# Patient Record
Sex: Male | Born: 1980 | Race: White | Hispanic: No | Marital: Married | Smoking: Current some day smoker
Health system: Southern US, Community
[De-identification: ages and names within clinical notes are randomized; demographics above are authoritative.]

## PROBLEM LIST (undated history)

## (undated) DIAGNOSIS — N200 Calculus of kidney: Secondary | ICD-10-CM

## (undated) DIAGNOSIS — Z87442 Personal history of urinary calculi: Secondary | ICD-10-CM

## (undated) HISTORY — PX: OTHER SURGICAL HISTORY: SHX169

## (undated) HISTORY — PX: HERNIA REPAIR: SHX51

## (undated) HISTORY — PX: ELBOW SURGERY: SHX618

---
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
## 2003-08-26 ENCOUNTER — Emergency Department (HOSPITAL_COMMUNITY): Admission: EM | Admit: 2003-08-26 | Discharge: 2003-08-26 | Payer: Self-pay | Admitting: *Deleted

## 2005-11-01 ENCOUNTER — Emergency Department (HOSPITAL_COMMUNITY): Admission: EM | Admit: 2005-11-01 | Discharge: 2005-11-01 | Payer: Self-pay | Admitting: Emergency Medicine

## 2009-06-15 ENCOUNTER — Emergency Department (HOSPITAL_COMMUNITY): Admission: EM | Admit: 2009-06-15 | Discharge: 2009-06-15 | Payer: Self-pay | Admitting: Emergency Medicine

## 2010-05-18 ENCOUNTER — Emergency Department (HOSPITAL_COMMUNITY): Admission: EM | Admit: 2010-05-18 | Discharge: 2010-05-19 | Payer: Self-pay | Admitting: Emergency Medicine

## 2011-03-29 LAB — CBC
Hemoglobin: 16.8 g/dL (ref 13.0–17.0)
MCHC: 34.1 g/dL (ref 30.0–36.0)
MCV: 90.8 fL (ref 78.0–100.0)
RBC: 5.41 MIL/uL (ref 4.22–5.81)

## 2011-03-29 LAB — URINE MICROSCOPIC-ADD ON

## 2011-03-29 LAB — URINALYSIS, ROUTINE W REFLEX MICROSCOPIC
Bilirubin Urine: NEGATIVE
Nitrite: NEGATIVE
Specific Gravity, Urine: 1.035 — ABNORMAL HIGH (ref 1.005–1.030)
Urobilinogen, UA: 1 mg/dL (ref 0.0–1.0)

## 2011-03-29 LAB — BASIC METABOLIC PANEL
CO2: 26 mEq/L (ref 19–32)
Chloride: 106 mEq/L (ref 96–112)
Creatinine, Ser: 1.04 mg/dL (ref 0.4–1.5)
GFR calc Af Amer: >60 mL/min (ref >60)
Glucose, Bld: 153 mg/dL — ABNORMAL HIGH (ref 70–99)

## 2011-03-29 LAB — DIFFERENTIAL
Basophils Relative: 1 % (ref 0–1)
Eosinophils Absolute: 0.2 10*3/uL (ref 0.0–0.7)
Monocytes Absolute: 0.6 10*3/uL (ref 0.1–1.0)
Monocytes Relative: 5 % (ref 3–12)
Neutrophils Relative %: 77 % (ref 43–77)

## 2012-03-21 ENCOUNTER — Emergency Department (HOSPITAL_COMMUNITY)
Admission: EM | Admit: 2012-03-21 | Discharge: 2012-03-21 | Disposition: A | Discharge status: Home / Self Care | Payer: Self-pay | Attending: Emergency Medicine | Admitting: Emergency Medicine

## 2012-03-21 ENCOUNTER — Encounter (HOSPITAL_COMMUNITY): Payer: Self-pay | Admitting: Emergency Medicine

## 2012-03-21 DIAGNOSIS — X503XXA Overexertion from repetitive movements, initial encounter: Secondary | ICD-10-CM | POA: Insufficient documentation

## 2012-03-21 DIAGNOSIS — S39012A Strain of muscle, fascia and tendon of lower back, initial encounter: Secondary | ICD-10-CM

## 2012-03-21 DIAGNOSIS — R209 Unspecified disturbances of skin sensation: Secondary | ICD-10-CM | POA: Insufficient documentation

## 2012-03-21 DIAGNOSIS — S335XXA Sprain of ligaments of lumbar spine, initial encounter: Secondary | ICD-10-CM | POA: Insufficient documentation

## 2012-03-21 LAB — URINALYSIS, ROUTINE W REFLEX MICROSCOPIC
Bilirubin Urine: NEGATIVE
Hgb urine dipstick: NEGATIVE
Protein, ur: NEGATIVE mg/dL
Urobilinogen, UA: 0.2 mg/dL (ref 0.0–1.0)

## 2012-03-21 LAB — DIFFERENTIAL
Basophils Absolute: 0 10*3/uL (ref 0.0–0.1)
Eosinophils Absolute: 0.3 10*3/uL (ref 0.0–0.7)
Eosinophils Relative: 5 % (ref 0–5)

## 2012-03-21 LAB — POCT I-STAT, CHEM 8
Creatinine, Ser: 0.8 mg/dL (ref 0.50–1.35)
Hemoglobin: 15.6 g/dL (ref 13.0–17.0)
Sodium: 142 mEq/L (ref 135–145)
TCO2: 26 mmol/L (ref 0–100)

## 2012-03-21 LAB — CBC
MCH: 30.6 pg (ref 26.0–34.0)
MCV: 88.7 fL (ref 78.0–100.0)
Platelets: 237 10*3/uL (ref 150–400)
RDW: 12.6 % (ref 11.5–15.5)

## 2012-03-21 LAB — POCT I-STAT TROPONIN I: Troponin i, poc: 0 ng/mL (ref 0.00–0.08)

## 2012-03-21 MED ORDER — METHOCARBAMOL 500 MG PO TABS
500.0000 mg | ORAL_TABLET | ORAL | Status: AC
  Administered 2012-03-21: 500 mg via ORAL
  Filled 2012-03-21: qty 1

## 2012-03-21 MED ORDER — TRAMADOL HCL 50 MG PO TABS: 50.0000 mg | ORAL_TABLET | Freq: Four times a day (QID) | ORAL | Status: AC | PRN

## 2012-03-21 MED ORDER — DEXAMETHASONE SODIUM PHOSPHATE 10 MG/ML IJ SOLN
10.0000 mg | Freq: Once | INTRAMUSCULAR | Status: AC
  Administered 2012-03-21: 10 mg via INTRAMUSCULAR
  Filled 2012-03-21: qty 1

## 2012-03-21 MED ORDER — CYCLOBENZAPRINE HCL 10 MG PO TABS: 10.0000 mg | ORAL_TABLET | Freq: Two times a day (BID) | ORAL | Status: DC | PRN

## 2012-03-21 MED ORDER — KETOROLAC TROMETHAMINE 60 MG/2ML IM SOLN
60.0000 mg | Freq: Once | INTRAMUSCULAR | Status: AC
  Administered 2012-03-21: 60 mg via INTRAMUSCULAR
  Filled 2012-03-21: qty 2

## 2012-03-21 MED ORDER — CYCLOBENZAPRINE HCL 10 MG PO TABS: 10.0000 mg | ORAL_TABLET | Freq: Two times a day (BID) | ORAL | Status: AC | PRN

## 2012-03-21 NOTE — Discharge Instructions (Signed)
Back Pain, Adult Low back pain is very common. About 1 in 5 people have back pain.The cause of low back pain is rarely dangerous. The pain often gets better over time.About half of people with a sudden onset of back pain feel better in just 2 weeks. About 8 in 10 people feel better by 6 weeks.  CAUSES Some common causes of back pain include:  Strain of the muscles or ligaments supporting the spine.   Wear and tear (degeneration) of the spinal discs.   Arthritis.   Direct injury to the back.  DIAGNOSIS Most of the time, the direct cause of low back pain is not known.However, back pain can be treated effectively even when the exact cause of the pain is unknown.Answering your caregiver's questions about your overall health and symptoms is one of the most accurate ways to make sure the cause of your pain is not dangerous. If your caregiver needs more information, he or she may order lab work or imaging tests (X-rays or MRIs).However, even if imaging tests show changes in your back, this usually does not require surgery. HOME CARE INSTRUCTIONS For many people, back pain returns.Since low back pain is rarely dangerous, it is often a condition that people can learn to manageon their own.   Remain active. It is stressful on the back to sit or stand in one place. Do not sit, drive, or stand in one place for more than 30 minutes at a time. Take short walks on level surfaces as soon as pain allows.Try to increase the length of time you walk each day.   Do not stay in bed.Resting more than 1 or 2 days can delay your recovery.   Do not avoid exercise or work.Your body is made to move.It is not dangerous to be active, even though your back may hurt.Your back will likely heal faster if you return to being active before your pain is gone.   Pay attention to your body when you bend and lift. Many people have less discomfortwhen lifting if they bend their knees, keep the load close to their  bodies,and avoid twisting. Often, the most comfortable positions are those that put less stress on your recovering back.   Find a comfortable position to sleep. Use a firm mattress and lie on your side with your knees slightly bent. If you lie on your back, put a pillow under your knees.   Only take over-the-counter or prescription medicines as directed by your caregiver. Over-the-counter medicines to reduce pain and inflammation are often the most helpful.Your caregiver may prescribe muscle relaxant drugs.These medicines help dull your pain so you can more quickly return to your normal activities and healthy exercise.   Put ice on the injured area.   Put ice in a plastic bag.   Place a towel between your skin and the bag.   Leave the ice on for 15 to 20 minutes, 3 to 4 times a day for the first 2 to 3 days. After that, ice and heat may be alternated to reduce pain and spasms.   Ask your caregiver about trying back exercises and gentle massage. This may be of some benefit.   Avoid feeling anxious or stressed.Stress increases muscle tension and can worsen back pain.It is important to recognize when you are anxious or stressed and learn ways to manage it.Exercise is a great option.  SEEK MEDICAL CARE IF:  You have pain that is not relieved with rest or medicine.   You have   pain that does not improve in 1 week.   You have new symptoms.   You are generally not feeling well.  SEEK IMMEDIATE MEDICAL CARE IF:   You have pain that radiates from your back into your legs.   You develop new bowel or bladder control problems.   You have unusual weakness or numbness in your arms or legs.   You develop nausea or vomiting.   You develop abdominal pain.   You feel faint.  Document Released: 12/06/2005 Document Revised: 11/25/2011 Document Reviewed: 04/26/2011 [employer] Patient Information 2012 [employer]. 

## 2012-03-21 NOTE — ED Notes (Signed)
Pt c/o right lower back pain with radiation down right leg; pt sts was moving this weekend and started after that

## 2012-03-21 NOTE — ED Provider Notes (Signed)
History     CSN: [id number]  Arrival date & time 03/21/12  4098   First MD Initiated Contact with Patient 03/21/12 1005     10:15AM HPI  Patient reports she was lifting heavy objects over this past weekend. States things A. he woke with right-sided lower back pain. States that pain radiated down right lateral leg. Reports pain is worse with standing up straight. Denies saddle anesthesias, perineal numbness, tingling, weakness, abdominal pain, nausea, vomiting, diarrhea, urinary symptoms, fever, or history of prior back pain. Patient is a 31 y.o. male presenting with back pain. The history is provided by the patient.  Back Pain  This is a new problem. The current episode started 2 days ago. The problem has been gradually worsening. The pain is associated with lifting heavy objects. The pain is present in the lumbar spine. The quality of the pain is described as stabbing and shooting. The pain radiates to the right thigh and right knee. The pain is severe. The symptoms are aggravated by twisting and certain positions. Associated symptoms include leg pain. Pertinent negatives include no chest pain, no fever, no numbness, no headaches, no abdominal pain, no bowel incontinence, no perianal numbness, no bladder incontinence, no dysuria, no pelvic pain, no paresthesias, no paresis, no tingling and no weakness. He has tried bed rest and NSAIDs for the symptoms. The treatment provided no relief.    History reviewed. No pertinent past medical history.  History reviewed. No pertinent past surgical history.  History reviewed. No pertinent family history.  History  Substance Use Topics  . Smoking status: Current Everyday Smoker  . Smokeless tobacco: Not on file  . Alcohol Use: No      Review of Systems  Constitutional: Positive for appetite change. Negative for fever and chills.  HENT: Negative for neck pain.   Respiratory: Negative for cough and shortness of breath.   Cardiovascular: Negative  for chest pain and palpitations.  Gastrointestinal: Negative for nausea, vomiting, abdominal pain and bowel incontinence.  Genitourinary: Negative for bladder incontinence, dysuria, hematuria, flank pain and pelvic pain.  Musculoskeletal: Positive for back pain. Negative for myalgias and gait problem.       Denies saddle anesthesias, perineal numbness, bowel incontinence, urinary incontinence  Neurological: Negative for dizziness, tingling, weakness, numbness, headaches and paresthesias.  All other systems reviewed and are negative.    Allergies  Sulfa antibiotics  Home Medications   Current Outpatient Rx  Name Route Sig Dispense Refill  . IBUPROFEN 200 MG PO TABS Oral Take 400 mg by mouth every 6 (six) hours as needed. For headache    . NAPROXEN SODIUM 220 MG PO TABS Oral Take 440 mg by mouth 2 (two) times daily as needed. Fir headache      BP 148/94  Pulse 77  Temp(Src) 97.5 F (36.4 C) (Oral)  Resp 22  SpO2 100%  Physical Exam  Constitutional: He is oriented to person, place, and time. He appears well-developed and well-nourished.  HENT:  Head: Normocephalic and atraumatic.  Eyes: Conjunctivae are normal. Pupils are equal, round, and reactive to light.  Neck: Normal range of motion. Neck supple.  Cardiovascular: Normal rate, regular rhythm and normal heart sounds.   Pulmonary/Chest: Effort normal and breath sounds normal.  Abdominal: Soft. Normal appearance and bowel sounds are normal. There is no hepatosplenomegaly. There is no rigidity, no rebound, no guarding, no CVA tenderness, no tenderness at McBurney's point and negative Murphy's sign. No hernia.    Musculoskeletal:  Lumbar back: He exhibits tenderness, pain and spasm. He exhibits normal range of motion, no bony tenderness, no swelling and normal pulse.       Back:  Neurological: He is alert and oriented to person, place, and time.  Skin: Skin is warm and dry. No rash noted. No erythema. No pallor.    Psychiatric: He has a normal mood and affect. His behavior is normal.    ED Course  Procedures  Results for orders placed during the hospital encounter of 03/21/12  URINALYSIS, ROUTINE W REFLEX MICROSCOPIC      Component Value Range   Color, Urine YELLOW  YELLOW    APPearance CLEAR  CLEAR    Specific Gravity, Urine 1.029  1.005 - 1.030    pH 6.0  5.0 - 8.0    Glucose, UA NEGATIVE  NEGATIVE (mg/dL)   Hgb urine dipstick NEGATIVE  NEGATIVE    Bilirubin Urine NEGATIVE  NEGATIVE    Ketones, ur NEGATIVE  NEGATIVE (mg/dL)   Protein, ur NEGATIVE  NEGATIVE (mg/dL)   Urobilinogen, UA 0.2  0.0 - 1.0 (mg/dL)   Nitrite NEGATIVE  NEGATIVE    Leukocytes, UA NEGATIVE  NEGATIVE   CBC      Component Value Range   WBC 5.1  4.0 - 10.5 (K/uL)   RBC 4.94  4.22 - 5.81 (MIL/uL)   Hemoglobin 15.1  13.0 - 17.0 (g/dL)   HCT 96.0  45.4 - 09.8 (%)   MCV 88.7  78.0 - 100.0 (fL)   MCH 30.6  26.0 - 34.0 (pg)   MCHC 34.5  30.0 - 36.0 (g/dL)   RDW 11.9  14.7 - 82.9 (%)   Platelets 237  150 - 400 (K/uL)  DIFFERENTIAL      Component Value Range   Neutrophils Relative 55  43 - 77 (%)   Neutro Abs 2.8  1.7 - 7.7 (K/uL)   Lymphocytes Relative 34  12 - 46 (%)   Lymphs Abs 1.7  0.7 - 4.0 (K/uL)   Monocytes Relative 7  3 - 12 (%)   Monocytes Absolute 0.3  0.1 - 1.0 (K/uL)   Eosinophils Relative 5  0 - 5 (%)   Eosinophils Absolute 0.3  0.0 - 0.7 (K/uL)   Basophils Relative 0  0 - 1 (%)   Basophils Absolute 0.0  0.0 - 0.1 (K/uL)  POCT I-STAT, CHEM 8      Component Value Range   Sodium 142  135 - 145 (mEq/L)   Potassium 4.5  3.5 - 5.1 (mEq/L)   Chloride 106  96 - 112 (mEq/L)   BUN 14  6 - 23 (mg/dL)   Creatinine, Ser 5.62  0.50 - 1.35 (mg/dL)   Glucose, Bld 98  70 - 99 (mg/dL)   Calcium, Ion 1.30  8.65 - 1.32 (mmol/L)   TCO2 26  0 - 100 (mmol/L)   Hemoglobin 15.6  13.0 - 17.0 (g/dL)   HCT 78.4  69.6 - 29.5 (%)  POCT I-STAT TROPONIN I      Component Value Range   Troponin i, poc 0.00  0.00 - 0.08  (ng/mL)   Comment 3            No results found.   MDM   Labs are all within normal limits. Patient has mild relief after medication. Will give additional Toradol shot and will sendhome with Ultram and Flexeril. Patient agrees with plan and is ready for discharge.      Thomasene Lot, PA-C 03/21/12  1118 

## 2012-03-21 NOTE — Progress Notes (Signed)
Met with patient and discussed Medicaid options and orange card. Directed family to their DSS case worker for coverage for the children. Gave patient a community discount prescription card.

## 2012-03-21 NOTE — ED Notes (Signed)
Pt ambulated to and from bathroom to void, tolerated well.

## 2012-03-21 NOTE — ED Notes (Signed)
Pt presents with R sided lower back pain since Sunday.  Pt reports moving furniture on Saturday with onset of pain the next day.  Pt reports chronic back pain, but mainly on L side.  Pt reports pain radiates into R thigh.  Pt denies any urinary or fecal incontinence.

## 2012-03-21 NOTE — ED Provider Notes (Signed)
Medical screening examination/treatment/procedure(s) were performed by non-physician practitioner and as supervising physician I was immediately available for consultation/collaboration.  Toy Baker, MD 03/21/12 1550

## 2012-03-29 ENCOUNTER — Encounter (HOSPITAL_COMMUNITY): Payer: Self-pay | Admitting: Emergency Medicine

## 2012-03-29 ENCOUNTER — Emergency Department (HOSPITAL_COMMUNITY)
Admission: EM | Admit: 2012-03-29 | Discharge: 2012-03-29 | Disposition: A | Discharge status: Home / Self Care | Payer: Self-pay | Attending: Emergency Medicine | Admitting: Emergency Medicine

## 2012-03-29 DIAGNOSIS — K089 Disorder of teeth and supporting structures, unspecified: Secondary | ICD-10-CM | POA: Insufficient documentation

## 2012-03-29 DIAGNOSIS — K0889 Other specified disorders of teeth and supporting structures: Secondary | ICD-10-CM

## 2012-03-29 DIAGNOSIS — F17200 Nicotine dependence, unspecified, uncomplicated: Secondary | ICD-10-CM | POA: Insufficient documentation

## 2012-03-29 MED ORDER — NAPROXEN 500 MG PO TABS: 500.0000 mg | ORAL_TABLET | Freq: Two times a day (BID) | ORAL | Status: DC

## 2012-03-29 MED ORDER — HYDROCODONE-ACETAMINOPHEN 5-325 MG PO TABS: ORAL_TABLET | ORAL | Status: AC

## 2012-03-29 MED ORDER — HYDROCODONE-ACETAMINOPHEN 5-325 MG PO TABS
1.0000 | ORAL_TABLET | Freq: Once | ORAL | Status: AC
  Administered 2012-03-29: 1 via ORAL
  Filled 2012-03-29: qty 1

## 2012-03-29 MED ORDER — PENICILLIN V POTASSIUM 500 MG PO TABS: 500.0000 mg | ORAL_TABLET | Freq: Three times a day (TID) | ORAL | Status: AC

## 2012-03-29 NOTE — Discharge Instructions (Signed)
Please read and follow all provided instructions.  Your diagnoses today include:  1. Pain, dental     Tests performed today include:  Vital signs. See below for your results today.   Medications prescribed:   Vicodin (hydrocodone/acetaminophen) - narcotic pain medication  You have been prescribed narcotic pain medication such as Vicodin, Percocet, or Ultram: DO NOT drive or perform any activities that require you to be awake and alert because this medicine can make you drowsy. BE VERY CAREFUL not to take multiple medicines containing Tylenol (also called acetaminophen). Doing so can lead to an overdose which can damage your liver and cause liver failure and possibly death.    Naproxen - anti-inflammatory pain medication  Do not exceed 500mg  naproxen every 12 hours  You have been prescribed an anti-inflammatory medication or NSAID. Take with food. Take smallest effective dose for the shortest duration needed for your pain. Stop taking if you experience stomach pain or vomiting.    Penicillin - antibiotic for dental infection  You have been prescribed an antibiotic medicine: take the entire course of medicine even if you are feeling better. Stopping early can cause the antibiotic not to work.  Take any prescribed medications only as directed.  Home care instructions:  Follow any educational materials contained in this packet.  Follow-up instructions: Please follow-up with your dentist for further evaluation of your symptoms. If you do not have a dentist or primary care doctor -- see below for referral information.   The exam and treatment you received today has been provided on an emergency basis only. This is not a substitute for complete medical or dental care. If your problem worsens or new symptoms (problems) appear, and you are unable to arrange prompt follow-up care with your dentist, return to this location.  Return instructions:   Please return to the Emergency  Department if you experience worsening symptoms.  Please return if you develop a fever, you develop more swelling in your face or neck, you have trouble breathing or swallowing food.  Please return if you have any other emergent concerns.  Additional Information:  Your vital signs today were: BP 139/86  Pulse 96  Temp(Src) 98.6 F (37 C) (Oral)  Resp 18  SpO2 98% If your blood pressure (BP) was elevated above 135/85 this visit, please have this repeated by your doctor within one month. -------------- No Primary Care Doctor Call [facility]  [phone] Other agencies that provide inexpensive medical care    [facility]  [phone]    [facility]  [phone]    [facility]  [phone]    [facility]  [phone]    [facility]  [phone]    [facility]  [phone] -------------- RESOURCE GUIDE:  Dental Problems  Patients with Medicaid: [facility] [phone]                                            [phone] [address] Phone:  [phone]  Phone:  [phone]  If unable to pay or uninsured, contact:  [facility] or [facility]. to become qualified for the adult dental clinic.  Chronic Pain Problems Contact [facility]  [phone] Patients need to be referred by their primary care doctor.  Insufficient Money for Medicine Contact [employer]:  call "211" or [facility] [phone].  Psychological Services [facility]  [phone] [facility]  [phone] [facility]   [phone] (emergency services [phone])  Substance Abuse Resources [facility]  [phone] [facility] [phone] [facility] [place] [phone] Floydene Flock [phone] Residential & Outpatient Substance Abuse Program   [phone]  Abuse/Neglect [facility] Child Abuse Hotline 9035771029 [facility] Child Abuse Hotline [phone] (After Hours)  Emergency Shelter [facility] [phone]  M[facility] [place] [facility] [place] [phone] [employer] [facility] [phone]  [facility] Resources  [facility]     [employer]                          [facility] Dept. [address] [place]                       [address]      [address]  Blondell Reveal Phone:  [phone]                                   Phone:  [phone]                 Phone:  [phone]  [facility] Phone:  [phone]  [facility] Child Abuse Hotline [phone] [phone] (After Hours)

## 2012-03-29 NOTE — ED Notes (Signed)
Onset 3-4 months ago eating and chipped tooth. States pain started today worsening overtime currently 10/10 throbbing radiating to left neck.  Airway intact bilateral equal chest rise and fall.

## 2012-03-29 NOTE — ED Provider Notes (Signed)
History     CSN: [id number]  Arrival date & time 03/29/12  1524   First MD Initiated Contact with Patient 03/29/12 1556      Chief Complaint  Patient presents with  . Dental Pain    (Consider location/radiation/quality/duration/timing/severity/associated sxs/prior treatment) HPI Comments: Patient presents with tooth pain has worsened since this morning. Patient complains of pain in the lower left jaw. This is in an area of a broken tooth. He states that pain radiates to the neck and he feels like this area is swollen. Patient denies fever or trouble breathing. Patient has been using Orajel without relief.  Patient is a 31 y.o. male presenting with tooth pain. The history is provided by the patient.  Dental PainThe primary symptoms include mouth pain and headaches. Primary symptoms do not include fever, shortness of breath or sore throat. The symptoms began 6 to 12 hours ago. The symptoms are unchanged.  Additional symptoms include: facial swelling. Additional symptoms do not include: trouble swallowing and ear pain.    History reviewed. No pertinent past medical history.  History reviewed. No pertinent past surgical history.  No family history on file.  History  Substance Use Topics  . Smoking status: Current Everyday Smoker  . Smokeless tobacco: Not on file  . Alcohol Use: No      Review of Systems  Constitutional: Negative for fever.  HENT: Positive for facial swelling and dental problem. Negative for ear pain, sore throat, trouble swallowing and neck pain.   Respiratory: Negative for shortness of breath and stridor.   Skin: Negative for color change.  Neurological: Positive for headaches.    Allergies  Sulfa antibiotics  Home Medications   Current Outpatient Rx  Name Route Sig Dispense Refill  . CYCLOBENZAPRINE HCL 10 MG PO TABS Oral Take 1 tablet (10 mg total) by mouth 2 (two) times daily as needed for muscle spasms. 20 tablet 0  . NAPROXEN SODIUM 220 MG PO  TABS Oral Take 440 mg by mouth 2 (two) times daily as needed. for headache.    . TRAMADOL HCL 50 MG PO TABS Oral Take 1 tablet (50 mg total) by mouth every 6 (six) hours as needed for pain. 30 tablet 0    BP 139/86  Pulse 96  Temp(Src) 98.6 F (37 C) (Oral)  Resp 18  SpO2 98%  Physical Exam  Nursing note and vitals reviewed. Constitutional: He is oriented to person, place, and time. He appears well-developed and well-nourished.  HENT:  Head: Normocephalic and atraumatic. No trismus in the jaw.  Right Ear: Tympanic membrane, external ear and ear canal normal.  Left Ear: Tympanic membrane, external ear and ear canal normal.  Nose: Nose normal.  Mouth/Throat: Uvula is midline, oropharynx is clear and moist and mucous membranes are normal. Abnormal dentition. Dental caries present. No dental abscesses or uvula swelling. No tonsillar abscesses.       Patient with L mandibular tooth pain and tenderness to palpation in area of second molar. Tooth is noted to be fractured. No swelling or erythema noted on exam. No gross facial or neck swelling.  Eyes: Pupils are equal, round, and reactive to light.  Neck: Normal range of motion. Neck supple.       No neck swelling or Lugwig's angina  Neurological: He is alert and oriented to person, place, and time.  Skin: Skin is warm and dry.  Psychiatric: He has a normal mood and affect.    ED Course  Procedures (including critical care  time)  Labs Reviewed - No data to display No results found.   1. Pain, dental     4:19 PM Patient seen and examined. Work-up initiated. Medications ordered.   Vital signs reviewed and are as follows: Filed Vitals:   03/29/12 1530  BP: 139/86  Pulse: 96  Temp: 98.6 F (37 C)  Resp: 18   Patient counseled to take prescribed medications as directed, return with worsening facial or neck swelling, and to follow-up with his dentist as soon as possible.   Patient counseled on use of narcotic pain medications.  Counseled not to combine these medications with others containing tylenol. Urged not to drink alcohol, drive, or perform any other activities that requires focus while taking these medications. The patient verbalizes understanding and agrees with the plan.  MDM  Patient with toothache.  No gross abscess.  Exam unconcerning for Ludwig's angina or other deep tissue infection in neck.  Will treat with penicillin and pain medicine.  Urged patient to follow-up with dentist.           Renne Crigler, PA 03/29/12 [phone]

## 2012-03-31 NOTE — ED Provider Notes (Signed)
Medical screening examination/treatment/procedure(s) were performed by non-physician practitioner and as supervising physician I was immediately available for consultation/collaboration.  Latica Hohmann T Karletta Millay, MD 03/31/12 1547 

## 2012-07-08 ENCOUNTER — Encounter (HOSPITAL_COMMUNITY): Payer: Self-pay | Admitting: *Deleted

## 2012-07-08 ENCOUNTER — Emergency Department (HOSPITAL_COMMUNITY)
Admission: EM | Admit: 2012-07-08 | Discharge: 2012-07-08 | Discharge status: Against Medical Advice | Payer: Medicaid Other | Attending: Emergency Medicine | Admitting: Emergency Medicine

## 2012-07-08 DIAGNOSIS — T63461A Toxic effect of venom of wasps, accidental (unintentional), initial encounter: Secondary | ICD-10-CM | POA: Insufficient documentation

## 2012-07-08 DIAGNOSIS — T6391XA Toxic effect of contact with unspecified venomous animal, accidental (unintentional), initial encounter: Secondary | ICD-10-CM | POA: Insufficient documentation

## 2012-07-08 NOTE — ED Notes (Signed)
Pt sts that he is feeling much better and he doesn't want to stay. Tried to convince patient to let us watch him to ensure his condition doesn't worsen but pt refused.

## 2012-07-08 NOTE — ED Notes (Signed)
Reports wasp sting 20 mins pta. 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 Bee sting is to right side abd, redness noted. Denies any symptoms at this time, no sob, airway intact. resp e/u. Skin w/d.

## 2018-01-01 ENCOUNTER — Emergency Department (HOSPITAL_COMMUNITY)
Admission: EM | Admit: 2018-01-01 | Discharge: 2018-01-01 | Disposition: A | Discharge status: Home / Self Care | Payer: Self-pay | Attending: Emergency Medicine | Admitting: Emergency Medicine

## 2018-01-01 ENCOUNTER — Encounter (HOSPITAL_COMMUNITY): Payer: Self-pay | Admitting: *Deleted

## 2018-01-01 ENCOUNTER — Other Ambulatory Visit: Payer: Self-pay

## 2018-01-01 DIAGNOSIS — L03116 Cellulitis of left lower limb: Secondary | ICD-10-CM | POA: Insufficient documentation

## 2018-01-01 DIAGNOSIS — F17200 Nicotine dependence, unspecified, uncomplicated: Secondary | ICD-10-CM | POA: Insufficient documentation

## 2018-01-01 MED ORDER — CLINDAMYCIN HCL 150 MG PO CAPS: 300.0000 mg | ORAL_CAPSULE | Freq: Four times a day (QID) | ORAL | 0 refills | Status: DC

## 2018-01-01 MED ORDER — CLINDAMYCIN HCL 150 MG PO CAPS
300.0000 mg | ORAL_CAPSULE | Freq: Once | ORAL | Status: AC
  Administered 2018-01-01: 300 mg via ORAL
  Filled 2018-01-01: qty 2

## 2018-01-01 NOTE — ED Provider Notes (Signed)
4   [facility] EMERGENCY DEPARTMENT Provider Note   CSN: [id number] Arrival date & time: 01/01/18  2048     History   Chief Complaint Chief Complaint  Patient presents with  . Leg Pain    HPI Kyle Ellis is a 37 y.o. male.  Patient presents to the ER for evaluation of a tender knots and redness of the left thigh.  Symptoms began 3 days ago.  Patient reports that he injects testosterone into his thighs.  He did that 3 mornings ago and then went to the gym and worked out.  While he was working out he noted a large tender nodule on the anterior portion of the thigh where he injected.  Pain was much worse when he bent the knee.  Over the ensuing days he has developed redness over the anterior portion of the thigh.  The knot, however, has gone down.  He has not had any fever.      History reviewed. No pertinent past medical history.  There are no active problems to display for this patient.   Past Surgical History:  Procedure Laterality Date  . HERNIA REPAIR     20 years ago       Home Medications    Prior to Admission medications   Medication Sig Start Date End Date Taking? Authorizing Provider  ibuprofen (ADVIL,MOTRIN) 200 MG tablet Take 800 mg by mouth every 8 (eight) hours as needed (leg swelling).   Yes [provider]  clindamycin (CLEOCIN) 150 MG capsule Take 2 capsules (300 mg total) by mouth 4 (four) times daily. 01/01/18   Gilda CreasePollina, Thorvald Orsino J, MD    Family History No family history on file.  Social History Social History   Tobacco Use  . Smoking status: Current Every Day Smoker  Substance Use Topics  . Alcohol use: No  . Drug use: No     Allergies   Bee venom and Sulfa antibiotics   Review of Systems Review of Systems  Musculoskeletal: Positive for myalgias.  Skin: Positive for color change.  All other systems reviewed and are negative.    Physical Exam Updated Vital Signs BP 128/84 (BP Location: Left Arm)   Pulse 97    Temp 98.6 F (37 C) (Oral)   Resp 16   Ht 6\' 3"  (1.905 m)   Wt 91.2 kg (201 lb)   SpO2 99%   BMI 25.12 kg/m   Physical Exam  Constitutional: He is oriented to person, place, and time. He appears well-developed and well-nourished. No distress.  HENT:  Head: Normocephalic and atraumatic.  Right Ear: Hearing normal.  Left Ear: Hearing normal.  Nose: Nose normal.  Mouth/Throat: Oropharynx is clear and moist and mucous membranes are normal.  Eyes: Conjunctivae and EOM are normal. Pupils are equal, round, and reactive to light.  Neck: Normal range of motion. Neck supple.  Cardiovascular: Regular rhythm, S1 normal and S2 normal. Exam reveals no gallop and no friction rub.  No murmur heard. Pulmonary/Chest: Effort normal and breath sounds normal. No respiratory distress. He exhibits no tenderness.  Abdominal: Soft. Normal appearance and bowel sounds are normal. There is no hepatosplenomegaly. There is no tenderness. There is no rebound, no guarding, no tenderness at McBurney's point and negative Murphy's sign. No hernia.  Musculoskeletal: Normal range of motion.       Left upper leg: He exhibits tenderness (anterior).  Neurological: He is alert and oriented to person, place, and time. He has normal strength. No cranial  nerve deficit or sensory deficit. Coordination normal. GCS eye subscore is 4. GCS verbal subscore is 5. GCS motor subscore is 6.  Skin: Skin is warm, dry and intact. No rash noted. There is erythema (anterior left thigh). No cyanosis.  Psychiatric: He has a normal mood and affect. His speech is normal and behavior is normal. Thought content normal.  Nursing note and vitals reviewed.    ED Treatments / Results  Labs (all labs ordered are listed, but only abnormal results are displayed) Labs Reviewed - No data to display  EKG  EKG Interpretation None       Radiology No results found.  Procedures Procedures (including critical care time)  Medications Ordered in  ED Medications  clindamycin (CLEOCIN) capsule 300 mg (not administered)     Initial Impression / Assessment and Plan / ED Course  I have reviewed the triage vital signs and the nursing notes.  Pertinent labs & imaging results that were available during my care of the patient were reviewed by me and considered in my medical decision making (see chart for details).     Patient presents to the ER for evaluation of a tender knot in the left thigh at the area of intramuscular injection with surrounding erythema.  Is able to lift the leg without difficulty.  When he bends the knee he does report some "tightness" of the anterior thigh but no pain.  He reports that there was a large nodule there initially but this has gone down.  I suspect that there was some intra-muscular hematoma formation from the injection, but there is no sign of abscess at this time.  This includes looking at the area with ultrasound and only seeing striated muscle fibers with no evidence of any fluid collections. Will treat with antibiotics.  EMERGENCY DEPARTMENT US SOFT TISSUE INTERPRETATION "Study: Limited Soft Tissue Ultrasound"  INDICATIONS: Soft tissue infection Multiple views of the body part were obtained in real-time with a multi-frequency linear probe  PERFORMED BY: Myself IMAGES ARCHIVED?: Yes SIDE:Left BODY PART:Lower extremity INTERPRETATION:  No abcess noted and Cellulitis present    Final Clinical Impressions(s) / ED Diagnoses   Final diagnoses:  Cellulitis of left lower extremity    ED Discharge Orders        Ordered    clindamycin (CLEOCIN) 150 MG capsule  4 times daily     01/01/18 2326       Gilda Crease, MD 01/01/18 2326

## 2018-01-01 NOTE — ED Triage Notes (Addendum)
Pt reports left thigh swelling and pain since Thursday. Pt reports he took a testosterone shot and injected it into his left thigh. Pt reports continued swelling that started in his left thigh the redness and swelling is in his left knee.

## 2018-05-24 ENCOUNTER — Other Ambulatory Visit: Payer: Self-pay

## 2018-05-24 ENCOUNTER — Encounter (HOSPITAL_COMMUNITY): Payer: Self-pay | Admitting: Emergency Medicine

## 2018-05-24 ENCOUNTER — Emergency Department (HOSPITAL_COMMUNITY)
Admission: EM | Admit: 2018-05-24 | Discharge: 2018-05-24 | Disposition: A | Discharge status: Home / Self Care | Payer: Self-pay | Attending: Emergency Medicine | Admitting: Emergency Medicine

## 2018-05-24 ENCOUNTER — Emergency Department (HOSPITAL_COMMUNITY): Payer: Self-pay

## 2018-05-24 DIAGNOSIS — R202 Paresthesia of skin: Secondary | ICD-10-CM | POA: Insufficient documentation

## 2018-05-24 DIAGNOSIS — F17200 Nicotine dependence, unspecified, uncomplicated: Secondary | ICD-10-CM | POA: Insufficient documentation

## 2018-05-24 DIAGNOSIS — M5136 Other intervertebral disc degeneration, lumbar region: Secondary | ICD-10-CM | POA: Insufficient documentation

## 2018-05-24 DIAGNOSIS — Z79899 Other long term (current) drug therapy: Secondary | ICD-10-CM | POA: Insufficient documentation

## 2018-05-24 DIAGNOSIS — M5432 Sciatica, left side: Secondary | ICD-10-CM | POA: Insufficient documentation

## 2018-05-24 MED ORDER — DEXAMETHASONE SODIUM PHOSPHATE 10 MG/ML IJ SOLN
10.0000 mg | Freq: Once | INTRAMUSCULAR | Status: AC
  Administered 2018-05-24: 10 mg via INTRAMUSCULAR
  Filled 2018-05-24: qty 1

## 2018-05-24 MED ORDER — ONDANSETRON HCL 4 MG PO TABS
4.0000 mg | ORAL_TABLET | Freq: Once | ORAL | Status: AC
  Administered 2018-05-24: 4 mg via ORAL
  Filled 2018-05-24: qty 1

## 2018-05-24 MED ORDER — DIAZEPAM 5 MG PO TABS
ORAL_TABLET | ORAL | Status: AC
  Filled 2018-05-24: qty 1

## 2018-05-24 MED ORDER — DICLOFENAC SODIUM 75 MG PO TBEC: 75.0000 mg | DELAYED_RELEASE_TABLET | Freq: Two times a day (BID) | ORAL | 0 refills | Status: DC

## 2018-05-24 MED ORDER — DEXAMETHASONE 4 MG PO TABS: 4.0000 mg | ORAL_TABLET | Freq: Two times a day (BID) | ORAL | 0 refills | Status: DC

## 2018-05-24 MED ORDER — CYCLOBENZAPRINE HCL 10 MG PO TABS: 10.0000 mg | ORAL_TABLET | Freq: Three times a day (TID) | ORAL | 0 refills | Status: DC

## 2018-05-24 MED ORDER — KETOROLAC TROMETHAMINE 10 MG PO TABS
10.0000 mg | ORAL_TABLET | Freq: Once | ORAL | Status: AC
  Administered 2018-05-24: 10 mg via ORAL
  Filled 2018-05-24: qty 1

## 2018-05-24 MED ORDER — DIAZEPAM 5 MG PO TABS
10.0000 mg | ORAL_TABLET | Freq: Once | ORAL | Status: AC
  Administered 2018-05-24: 10 mg via ORAL
  Filled 2018-05-24: qty 2

## 2018-05-24 NOTE — Discharge Instructions (Addendum)
Your x-ray shows loss of the disc height at the L3-L4, L4-L5, and L5-S1 area.  Your examination is negative for an  emergent neurologic change, but there is noted changes in your sensory area, as well as in your strength area on the left.  Please see Dr. Eulah PontMurphy for orthopedic evaluation.  An order for outpatient MRI has been sent to the MRI office.  Please call the radiology department for a time for your MRI on tomorrow.  Please use Flexeril 3 times daily for spasm pain, Decadron 2 times daily and diclofenac 2 times daily with food.  Flexeril may cause drowsiness.  Please do not drive a vehicle, drink alcohol, operate machinery, or participate in activities requiring concentration when taking this medication.

## 2018-05-24 NOTE — ED Triage Notes (Signed)
Pt c/o left lower back pain radiating into left leg x 4 weeks. Getting worse per pt. Aleve with no relief.

## 2018-05-24 NOTE — ED Notes (Signed)
Patient returned from radiology

## 2018-05-24 NOTE — ED Provider Notes (Signed)
[facility] EMERGENCY DEPARTMENT Provider Note   CSN: [id number] Arrival date & time: 05/24/18  1737     History   Chief Complaint Chief Complaint  Patient presents with  . Back Pain    HPI Kyle Ellis is a 37 y.o. male.  Patient is a 37 year old male who presents to the emergency department with a complaint of back pain.  The patient states that this problem is been going on at least for 4 weeks.  The patient states that during his teen years he did a lot of weightlifting and was very aggressive in sports.  He injured his back during that time, he is not sure what diagnosis he was told he had, but was told eventually he may have to have surgery.  Through the years the patient states he has been working out on a regular basis, but now he notices that he has pain in the lower back that radiates into the left buttocks, down the left leg down to the ankle.  The patient states that there are times when he has numbness of his thigh.  He has to look to see if his wife might be touching his thigh especially when they are driving.  The patient states that he thinks he is also noticed a change in sensation in his private area, as he notices more difficulty with becoming aroused during sexual activities.  The patient denies any loss of bowel or bladder function.  He states that he has some weakness of his leg, and has to concentrate on putting his left foot in front of the right, and it seems stiff or heavy at times.  The pain and the numbness are worse with standing, and better with lying down.  The patient also notices that there is an improvement when he is exercising, but when he cools off after exercising the pain and stiffness and numbness return in some time or worse.     History reviewed. No pertinent past medical history.  There are no active problems to display for this patient.   Past Surgical History:  Procedure Laterality Date  . HERNIA REPAIR     20 years ago        Home  Medications    Prior to Admission medications   Medication Sig Start Date End Date Taking? Authorizing Provider  clindamycin (CLEOCIN) 150 MG capsule Take 2 capsules (300 mg total) by mouth 4 (four) times daily. 01/01/18   Gilda Crease, MD  ibuprofen (ADVIL,MOTRIN) 200 MG tablet Take 800 mg by mouth every 8 (eight) hours as needed (leg swelling).    [provider]    Family History History reviewed. No pertinent family history.  Social History Social History   Tobacco Use  . Smoking status: Current Every Day Smoker  . Smokeless tobacco: Former Engineer, water Use Topics  . Alcohol use: Yes    Comment: weekends  . Drug use: No     Allergies   Bee venom and Sulfa antibiotics   Review of Systems Review of Systems  Constitutional: Negative for activity change.       All ROS Neg except as noted in HPI  HENT: Negative for nosebleeds.   Eyes: Negative for photophobia and discharge.  Respiratory: Negative for cough, shortness of breath and wheezing.   Cardiovascular: Negative for chest pain and palpitations.  Gastrointestinal: Negative for abdominal pain and blood in stool.  Genitourinary: Negative for dysuria, frequency and hematuria.  Musculoskeletal: Positive for back pain  and gait problem. Negative for arthralgias and neck pain.  Skin: Negative.   Neurological: Positive for weakness and numbness. Negative for dizziness, seizures and speech difficulty.  Psychiatric/Behavioral: Negative for confusion and hallucinations.     Physical Exam Updated Vital Signs BP 127/90 (BP Location: Right Arm)   Pulse 61   Temp 98 F (36.7 C) (Oral)   Resp 18   SpO2 98%   Physical Exam  Constitutional: He is oriented to person, place, and time. He appears well-developed and well-nourished.  Non-toxic appearance.  HENT:  Head: Normocephalic.  Right Ear: Tympanic membrane and external ear normal.  Left Ear: Tympanic membrane and external ear normal.  Eyes: Pupils  are equal, round, and reactive to light. EOM and lids are normal.  Neck: Normal range of motion. Neck supple. Carotid bruit is not present.  Cardiovascular: Normal rate, regular rhythm, normal heart sounds, intact distal pulses and normal pulses.  Pulmonary/Chest: Breath sounds normal. No respiratory distress.  Abdominal: Soft. Bowel sounds are normal. There is no tenderness. There is no guarding.  Musculoskeletal:       Lumbar back: He exhibits decreased range of motion, tenderness and spasm.       Back:       Legs: Lymphadenopathy:       Head (right side): No submandibular adenopathy present.       Head (left side): No submandibular adenopathy present.    He has no cervical adenopathy.  Neurological: He is alert and oriented to person, place, and time. He has normal strength. No cranial nerve deficit or sensory deficit.  Skin: Skin is warm and dry.  Psychiatric: He has a normal mood and affect. His speech is normal.  Nursing note and vitals reviewed.    ED Treatments / Results  Labs (all labs ordered are listed, but only abnormal results are displayed) Labs Reviewed - No data to display  EKG None  Radiology Dg Lumbar Spine Complete  Result Date: 05/24/2018 CLINICAL DATA:  Left-sided lower back pain with left leg radiculopathy. EXAM: LUMBAR SPINE - COMPLETE 4+ VIEW COMPARISON:  CT abdomen pelvis dated June 15, 2009. FINDINGS: Five lumbar type vertebral bodies. No acute fracture or subluxation. Vertebral body heights are preserved. Alignment is normal. Mild-to-moderate disc height loss at L3-L4, L4-L5, and L5-S1, slightly progressed when compared to prior study. Mild facet arthropathy at L4-L5 and L5-S1. The sacroiliac joints are unremarkable. IMPRESSION: 1. Mild-to-moderate degenerative disc disease of the lower lumbar spine. No acute osseous abnormality. Electronically Signed   By: Obie DredgeWilliam T Derry M.D.   On: 05/24/2018 19:35    Procedures Procedures (including critical care  time)  Medications Ordered in ED Medications  dexamethasone (DECADRON) injection 10 mg (has no administration in time range)  ketorolac (TORADOL) tablet 10 mg (has no administration in time range)  ondansetron (ZOFRAN) tablet 4 mg (has no administration in time range)  diazepam (VALIUM) 5 MG tablet (has no administration in time range)  diazepam (VALIUM) tablet 10 mg (10 mg Oral Given 05/24/18 2039)     Initial Impression / Assessment and Plan / ED Course  I have reviewed the triage vital signs and the nursing notes.  Pertinent labs & imaging results that were available during my care of the patient were reviewed by me and considered in my medical decision making (see chart for details).       Final Clinical Impressions(s) / ED Diagnoses Vital signs are within normal limits.  Patient states he can feel touch to his left  thigh, but it feels numb.  Patient does not have foot drop, but states he has to concentrate on putting his left foot in front of the right.  He currently has pain that is 8 on a scale of 1-10. X-ray of the lumbar spine reveals mild to moderate disc height loss at the L3-L4 area, the L4-L5 area, and the L5-S1 area.  There is slight progression when this is compared to a prior study.  The sacroiliac joints are unremarkable. Pain improved to 2/10 after medication.  After medication, the patient's gait is better.  There continues to be no evidence of foot drop.  There is been no loss of bowel or bladder function during his visit here in the emergency department.  The patient feels better after medication.  The plan at this time is for the patient to have an outpatient MRI.  Patient is referred to Dr. Renaye Rakers for orthopedic evaluation.  Prescription for Flexeril, Decadron, and diclofenac given to the patient.  The patient will return to the emergency department immediately if any emergent changes in his condition.  Patient is in agreement with this plan.   Final diagnoses:   Left sided sciatica  Degenerative disc disease, lumbar  Paresthesia of left leg    ED Discharge Orders        Ordered    MR LUMBAR SPINE WO CONTRAST    Comments:  Paresthesia left lower extremity, increasing back pain. Increasing weakness of the left lower extremity. DDD of L spine from L3-L4 to L5-S1 on plain film.   05/24/18 2039       Ivery Quale, PA-C 05/24/18 2240    Maia Plan, MD 05/24/18 2256

## 2018-05-25 ENCOUNTER — Ambulatory Visit (HOSPITAL_COMMUNITY)
Admission: RE | Admit: 2018-05-25 | Discharge: 2018-05-25 | Disposition: A | Discharge status: Home / Self Care | Payer: Self-pay | Source: Ambulatory Visit | Attending: Physician Assistant | Admitting: Physician Assistant

## 2018-05-25 DIAGNOSIS — R531 Weakness: Secondary | ICD-10-CM | POA: Insufficient documentation

## 2018-05-25 DIAGNOSIS — M5127 Other intervertebral disc displacement, lumbosacral region: Secondary | ICD-10-CM | POA: Insufficient documentation

## 2018-05-25 DIAGNOSIS — M5126 Other intervertebral disc displacement, lumbar region: Secondary | ICD-10-CM | POA: Insufficient documentation

## 2018-05-25 DIAGNOSIS — M48061 Spinal stenosis, lumbar region without neurogenic claudication: Secondary | ICD-10-CM | POA: Insufficient documentation

## 2018-05-25 DIAGNOSIS — R202 Paresthesia of skin: Secondary | ICD-10-CM | POA: Insufficient documentation

## 2019-06-14 ENCOUNTER — Other Ambulatory Visit: Payer: Self-pay

## 2019-06-14 DIAGNOSIS — Z20822 Contact with and (suspected) exposure to covid-19: Secondary | ICD-10-CM

## 2019-06-18 LAB — NOVEL CORONAVIRUS, NAA: SARS-CoV-2, NAA: NOT DETECTED

## 2020-04-18 ENCOUNTER — Encounter (HOSPITAL_COMMUNITY): Payer: Self-pay | Admitting: *Deleted

## 2020-04-18 ENCOUNTER — Other Ambulatory Visit: Payer: Self-pay

## 2020-04-18 ENCOUNTER — Emergency Department (HOSPITAL_COMMUNITY)
Admission: EM | Admit: 2020-04-18 | Discharge: 2020-04-18 | Disposition: A | Discharge status: Home / Self Care | Payer: BC Managed Care – PPO | Attending: Emergency Medicine | Admitting: Emergency Medicine

## 2020-04-18 DIAGNOSIS — Z5321 Procedure and treatment not carried out due to patient leaving prior to being seen by health care provider: Secondary | ICD-10-CM | POA: Insufficient documentation

## 2020-04-18 DIAGNOSIS — R1030 Lower abdominal pain, unspecified: Secondary | ICD-10-CM | POA: Insufficient documentation

## 2020-04-18 DIAGNOSIS — Z87442 Personal history of urinary calculi: Secondary | ICD-10-CM | POA: Diagnosis not present

## 2020-04-18 LAB — CBC
HCT: 43.9 % (ref 39.0–52.0)
Hemoglobin: 14.7 g/dL (ref 13.0–17.0)
MCH: 30.2 pg (ref 26.0–34.0)
MCHC: 33.5 g/dL (ref 30.0–36.0)
MCV: 90.1 fL (ref 80.0–100.0)
Platelets: 280 10*3/uL (ref 150–400)
RBC: 4.87 MIL/uL (ref 4.22–5.81)
RDW: 12 % (ref 11.5–15.5)
WBC: 7.5 10*3/uL (ref 4.0–10.5)
nRBC: 0 % (ref 0.0–0.2)

## 2020-04-18 LAB — BASIC METABOLIC PANEL
Anion gap: 8 (ref 5–15)
BUN: 15 mg/dL (ref 6–20)
CO2: 26 mmol/L (ref 22–32)
Calcium: 10.3 mg/dL (ref 8.9–10.3)
Chloride: 105 mmol/L (ref 98–111)
Creatinine, Ser: 1.03 mg/dL (ref 0.61–1.24)
GFR calc Af Amer: >60 mL/min (ref >60)
GFR calc non Af Amer: >60 mL/min (ref >60)
Glucose, Bld: 87 mg/dL (ref 70–99)
Potassium: 3.9 mmol/L (ref 3.5–5.1)
Sodium: 139 mmol/L (ref 135–145)

## 2020-04-18 LAB — URINALYSIS, ROUTINE W REFLEX MICROSCOPIC
Bacteria, UA: NONE SEEN
Bilirubin Urine: NEGATIVE
Glucose, UA: NEGATIVE mg/dL
Ketones, ur: 5 mg/dL — AB
Leukocytes,Ua: NEGATIVE
Nitrite: NEGATIVE
Protein, ur: NEGATIVE mg/dL
Specific Gravity, Urine: 1.023 (ref 1.005–1.030)
pH: 5 (ref 5.0–8.0)

## 2020-04-18 NOTE — ED Triage Notes (Signed)
Low abdominal pain x 3 days. History of kidney stones

## 2020-04-19 ENCOUNTER — Encounter (HOSPITAL_COMMUNITY): Payer: Self-pay | Admitting: Emergency Medicine

## 2020-04-19 ENCOUNTER — Emergency Department (HOSPITAL_COMMUNITY): Payer: BC Managed Care – PPO

## 2020-04-19 ENCOUNTER — Other Ambulatory Visit: Payer: Self-pay

## 2020-04-19 ENCOUNTER — Emergency Department (HOSPITAL_COMMUNITY)
Admission: EM | Admit: 2020-04-19 | Discharge: 2020-04-19 | Disposition: A | Discharge status: Home / Self Care | Payer: BC Managed Care – PPO | Attending: Emergency Medicine | Admitting: Emergency Medicine

## 2020-04-19 DIAGNOSIS — Z79899 Other long term (current) drug therapy: Secondary | ICD-10-CM | POA: Insufficient documentation

## 2020-04-19 DIAGNOSIS — N23 Unspecified renal colic: Secondary | ICD-10-CM | POA: Diagnosis not present

## 2020-04-19 DIAGNOSIS — F17200 Nicotine dependence, unspecified, uncomplicated: Secondary | ICD-10-CM | POA: Insufficient documentation

## 2020-04-19 DIAGNOSIS — R109 Unspecified abdominal pain: Secondary | ICD-10-CM | POA: Diagnosis present

## 2020-04-19 DIAGNOSIS — N201 Calculus of ureter: Secondary | ICD-10-CM | POA: Insufficient documentation

## 2020-04-19 DIAGNOSIS — N200 Calculus of kidney: Secondary | ICD-10-CM

## 2020-04-19 MED ORDER — TAMSULOSIN HCL 0.4 MG PO CAPS: 0.4000 mg | ORAL_CAPSULE | Freq: Every day | ORAL | 0 refills | Status: AC

## 2020-04-19 MED ORDER — TAMSULOSIN HCL 0.4 MG PO CAPS
0.4000 mg | ORAL_CAPSULE | Freq: Once | ORAL | Status: AC
  Administered 2020-04-19: 0.4 mg via ORAL
  Filled 2020-04-19: qty 1

## 2020-04-19 MED ORDER — SODIUM CHLORIDE 0.9 % IV BOLUS
1000.0000 mL | Freq: Once | INTRAVENOUS | Status: AC
  Administered 2020-04-19: 1000 mL via INTRAVENOUS

## 2020-04-19 MED ORDER — KETOROLAC TROMETHAMINE 30 MG/ML IJ SOLN
30.0000 mg | Freq: Once | INTRAMUSCULAR | Status: AC
  Administered 2020-04-19: 30 mg via INTRAVENOUS
  Filled 2020-04-19: qty 1

## 2020-04-19 MED ORDER — OXYCODONE-ACETAMINOPHEN 5-325 MG PO TABS: 1.0000 | ORAL_TABLET | Freq: Four times a day (QID) | ORAL | 0 refills | Status: DC | PRN

## 2020-04-19 NOTE — Discharge Instructions (Addendum)
It was our pleasure to provide your ER care today - we hope that you feel better.  Drink plenty of fluids. Strain urine. Take flomax as prescribed.   Take motrin or aleve as need for pain. You may also take percocet as need for pain. No driving for the next 6 hours or when taking percocet. Also, do not take tylenol or acetaminophen containing medication when taking percocet.   Follow up with urologist Monday if symptoms fail to improve/resolve - call office Monday AM.   Return to [facility] ER (where our urology program is based)  if worse, new symptoms, high fevers, worsening or severe pain, persistent vomiting, or other concern.

## 2020-04-19 NOTE — ED Notes (Signed)
Patient given discharge instructions. Questions were answered. Patient verbalized understanding of discharge instructions and care at home.  

## 2020-04-19 NOTE — ED Provider Notes (Signed)
[facility] EMERGENCY DEPARTMENT Provider Note   CSN: [id number] Arrival date & time: 04/19/20  2778     History Chief Complaint  Patient presents with  . Flank Pain  . Abdominal Pain  . Hematuria    Kyle Ellis is a 39 y.o. male.  Patient w remote hx kidney stone, c/o left flank pain. Symptoms acute onset 4 days ago, moderate, dull, radiating from posterior left flank to left groin. Unsure if same as prior kidney stone pain - states pain then was much shorter lived. Denies scrotal or testicular pain. +hematuria. no back strain.   The history is provided by the patient.  Flank Pain Associated symptoms include abdominal pain. Pertinent negatives include no chest pain, no headaches and no shortness of breath.  Abdominal Pain Associated symptoms: hematuria   Associated symptoms: no chest pain, no fever, no shortness of breath, no sore throat and no vomiting   Hematuria Associated symptoms include abdominal pain. Pertinent negatives include no chest pain, no headaches and no shortness of breath.       Past Medical History:  Diagnosis Date  . Kidney stone     There are no problems to display for this patient.   Past Surgical History:  Procedure Laterality Date  . HERNIA REPAIR     20 years ago       No family history on file.  Social History   Tobacco Use  . Smoking status: Current Every Day Smoker  . Smokeless tobacco: Former Engineer, water Use Topics  . Alcohol use: Yes    Comment: weekends  . Drug use: No    Home Medications Prior to Admission medications   Medication Sig Start Date End Date Taking? Authorizing Provider  clindamycin (CLEOCIN) 150 MG capsule Take 2 capsules (300 mg total) by mouth 4 (four) times daily. 01/01/18   Gilda Crease, MD  cyclobenzaprine (FLEXERIL) 10 MG tablet Take 1 tablet (10 mg total) by mouth 3 (three) times daily. 05/24/18   Ivery Quale, PA-C  dexamethasone (DECADRON) 4 MG tablet Take 1  tablet (4 mg total) by mouth 2 (two) times daily with a meal. 05/24/18   Ivery Quale, PA-C  diclofenac (VOLTAREN) 75 MG EC tablet Take 1 tablet (75 mg total) by mouth 2 (two) times daily. 05/24/18   Ivery Quale, PA-C  ibuprofen (ADVIL,MOTRIN) 200 MG tablet Take 800 mg by mouth every 8 (eight) hours as needed (leg swelling).    [provider]    Allergies    Bee venom and Sulfa antibiotics  Review of Systems   Review of Systems  Constitutional: Negative for fever.  HENT: Negative for sore throat.   Eyes: Negative for redness.  Respiratory: Negative for shortness of breath.   Cardiovascular: Negative for chest pain.  Gastrointestinal: Positive for abdominal pain. Negative for vomiting.  Genitourinary: Positive for flank pain and hematuria.  Musculoskeletal: Negative for neck pain.  Skin: Negative for rash.  Neurological: Negative for headaches.  Hematological: Does not bruise/bleed easily.  Psychiatric/Behavioral: Negative for confusion.    Physical Exam Updated Vital Signs BP (!) 161/97 (BP Location: Left Arm)   Pulse 70   Temp 98.3 F (36.8 C) (Oral)   Resp 16   SpO2 100%   Physical Exam Vitals and nursing note reviewed.  Constitutional:      Appearance: Normal appearance. He is well-developed.  HENT:     Head: Atraumatic.     Nose: Nose normal.     Mouth/Throat:  Mouth: Mucous membranes are moist.  Eyes:     General: No scleral icterus.    Conjunctiva/sclera: Conjunctivae normal.  Neck:     Trachea: No tracheal deviation.  Cardiovascular:     Rate and Rhythm: Normal rate and regular rhythm.     Pulses: Normal pulses.     Heart sounds: Normal heart sounds. No murmur. No friction rub. No gallop.   Pulmonary:     Effort: Pulmonary effort is normal. No accessory muscle usage or respiratory distress.     Breath sounds: Normal breath sounds.  Abdominal:     General: Bowel sounds are normal. There is no distension.     Palpations: Abdomen is soft.  There is no mass.     Tenderness: There is no abdominal tenderness. There is no guarding.  Genitourinary:    Comments: No cva tenderness. Musculoskeletal:        General: No swelling.     Cervical back: Normal range of motion and neck supple. No rigidity.     Comments: T/L spine non tender, aligned.   Skin:    General: Skin is warm and dry.     Findings: No rash.  Neurological:     Mental Status: He is alert.     Comments: Alert, speech clear.   Psychiatric:        Mood and Affect: Mood normal.     ED Results / Procedures / Treatments   Labs (all labs ordered are listed, but only abnormal results are displayed) Results for orders placed or performed during the hospital encounter of 04/18/20  Urinalysis, Routine w reflex microscopic- may I&O cath if menses  Result Value Ref Range   Color, Urine YELLOW YELLOW   APPearance CLEAR CLEAR   Specific Gravity, Urine 1.023 1.005 - 1.030   pH 5.0 5.0 - 8.0   Glucose, UA NEGATIVE NEGATIVE mg/dL   Hgb urine dipstick LARGE (A) NEGATIVE   Bilirubin Urine NEGATIVE NEGATIVE   Ketones, ur 5 (A) NEGATIVE mg/dL   Protein, ur NEGATIVE NEGATIVE mg/dL   Nitrite NEGATIVE NEGATIVE   Leukocytes,Ua NEGATIVE NEGATIVE   RBC / HPF 21-50 0 - 5 RBC/hpf   WBC, UA 0-5 0 - 5 WBC/hpf   Bacteria, UA NONE SEEN NONE SEEN   Squamous Epithelial / LPF 0-5 0 - 5   Mucus PRESENT   Basic metabolic panel  Result Value Ref Range   Sodium 139 135 - 145 mmol/L   Potassium 3.9 3.5 - 5.1 mmol/L   Chloride 105 98 - 111 mmol/L   CO2 26 22 - 32 mmol/L   Glucose, Bld 87 70 - 99 mg/dL   BUN 15 6 - 20 mg/dL   Creatinine, Ser 1.61 0.61 - 1.24 mg/dL   Calcium 09.6 8.9 - 04.5 mg/dL   GFR calc non Af Amer >60 >60 mL/min   GFR calc Af Amer >60 >60 mL/min   Anion gap 8 5 - 15  CBC  Result Value Ref Range   WBC 7.5 4.0 - 10.5 K/uL   RBC 4.87 4.22 - 5.81 MIL/uL   Hemoglobin 14.7 13.0 - 17.0 g/dL   HCT 40.9 81.1 - 91.4 %   MCV 90.1 80.0 - 100.0 fL   MCH 30.2 26.0 - 34.0  pg   MCHC 33.5 30.0 - 36.0 g/dL   RDW 78.2 95.6 - 21.3 %   Platelets 280 150 - 400 K/uL   nRBC 0.0 0.0 - 0.2 %   CT RENAL STONE STUDY  Result Date: 04/19/2020 CLINICAL DATA:  Left flank pain since Tuesday. Hematuria since Wednesday. History of kidney stone EXAM: CT ABDOMEN AND PELVIS WITHOUT CONTRAST TECHNIQUE: Multidetector CT imaging of the abdomen and pelvis was performed following the standard protocol without IV contrast. COMPARISON:  CT abdomen pelvis 06/15/2009 FINDINGS: Lower chest: Minimal bibasilar atelectasis. Evaluation of the abdominal viscera is somewhat limited by the lack of IV contrast. Hepatobiliary: No focal liver abnormality is seen. No gallstones, gallbladder wall thickening, or biliary dilatation. Pancreas: Unremarkable. No surrounding inflammatory changes. Spleen: Normal in size without focal abnormality. Adrenals/Urinary Tract: Adrenal glands are unremarkable. Left kidney is enlarged with perinephric stranding. There is moderate hydronephrosis and ureterectasis secondary to an obstructing stone in the distal left ureter measuring 5 mm. The right kidney is unremarkable. Normal appearance of the urinary bladder. Stomach/Bowel: Stomach is within normal limits. Appendix appears normal. No evidence of bowel wall thickening, distention, or inflammatory changes. Vascular/Lymphatic: No significant vascular findings are present. No enlarged abdominal or pelvic lymph nodes. Reproductive: Prostate is unremarkable. Other: No abdominal wall hernia or abnormality. No abdominopelvic ascites. Musculoskeletal: No acute or significant osseous findings. IMPRESSION: There is an obstructing 5 mm stone in the distal left ureter resulting in moderate left hydronephrosis and ureterectasis as well as edema of the left kidney. Electronically Signed   By: Audie Pinto M.D.   On: 04/19/2020 13:46     EKG None  Radiology CT RENAL STONE STUDY  Result Date: 04/19/2020 CLINICAL DATA:  Left flank pain  since Tuesday. Hematuria since Wednesday. History of kidney stone EXAM: CT ABDOMEN AND PELVIS WITHOUT CONTRAST TECHNIQUE: Multidetector CT imaging of the abdomen and pelvis was performed following the standard protocol without IV contrast. COMPARISON:  CT abdomen pelvis 06/15/2009 FINDINGS: Lower chest: Minimal bibasilar atelectasis. Evaluation of the abdominal viscera is somewhat limited by the lack of IV contrast. Hepatobiliary: No focal liver abnormality is seen. No gallstones, gallbladder wall thickening, or biliary dilatation. Pancreas: Unremarkable. No surrounding inflammatory changes. Spleen: Normal in size without focal abnormality. Adrenals/Urinary Tract: Adrenal glands are unremarkable. Left kidney is enlarged with perinephric stranding. There is moderate hydronephrosis and ureterectasis secondary to an obstructing stone in the distal left ureter measuring 5 mm. The right kidney is unremarkable. Normal appearance of the urinary bladder. Stomach/Bowel: Stomach is within normal limits. Appendix appears normal. No evidence of bowel wall thickening, distention, or inflammatory changes. Vascular/Lymphatic: No significant vascular findings are present. No enlarged abdominal or pelvic lymph nodes. Reproductive: Prostate is unremarkable. Other: No abdominal wall hernia or abnormality. No abdominopelvic ascites. Musculoskeletal: No acute or significant osseous findings. IMPRESSION: There is an obstructing 5 mm stone in the distal left ureter resulting in moderate left hydronephrosis and ureterectasis as well as edema of the left kidney. Electronically Signed   By: Audie Pinto M.D.   On: 04/19/2020 13:46    Procedures Procedures (including critical care time)  Medications Ordered in ED Medications  ketorolac (TORADOL) 30 MG/ML injection 30 mg (has no administration in time range)  sodium chloride 0.9 % bolus 1,000 mL (has no administration in time range)    ED Course  I have reviewed the triage  vital signs and the nursing notes.  Pertinent labs & imaging results that were available during my care of the patient were reviewed by me and considered in my medical decision making (see chart for details).    MDM Rules/Calculators/A&P  Iv ns. Pt notes very poor po intake for couple days. Ns bolus. Labs from yesterday reviewed (pt went to [facility] ED but wait for room was too long) - +hematuria.   Reviewed nursing notes and prior charts for additional history. Remote prior right kidney stone.   Last nights labs reviewed/interpreted by me - ua with blood. Renal fxn normal.   toradol iv (pt drove self to ED). Ivf.   CT pending.   CT reviewed/interpreted by me - distal left ureter stone.   Discussed ct w pt.   Pain is currently resolved. Pt appears stable for d/c.       Final Clinical Impression(s) / ED Diagnoses Final diagnoses:  None    Rx / DC Orders ED Discharge Orders    None       Cathren Laine, MD 04/19/20 [phone]

## 2020-04-19 NOTE — ED Triage Notes (Signed)
C/o L flank pain since Tuesday that radiates to L abd.  Also reports hematuria.  Seen at [facility] last night and had lab work.  States he LWBS due to wait.  Denies nausea and vomiting but states pain is worse after eating.

## 2020-07-10 ENCOUNTER — Encounter (HOSPITAL_COMMUNITY): Payer: Self-pay | Admitting: Urology

## 2020-07-10 ENCOUNTER — Other Ambulatory Visit: Payer: Self-pay | Admitting: Urology

## 2020-07-10 ENCOUNTER — Other Ambulatory Visit: Payer: Self-pay

## 2020-07-10 NOTE — Progress Notes (Signed)
Spoke with Sheilah Mins regarding surgery time of 1755pm and npo status Per orders of Sheilah Mins patient made aware can have breakfast by 0900am on 07/11/20.  Patient may then have clear liquids from 0900am until 1445pm on 07/11/20.  Reivewed clear liquid diet with patient.  Patient voiced understanding.

## 2020-07-11 ENCOUNTER — Encounter (HOSPITAL_COMMUNITY): Payer: Self-pay | Admitting: Urology

## 2020-07-11 ENCOUNTER — Encounter (HOSPITAL_COMMUNITY)
Admission: RE | Disposition: A | Discharge status: Home / Self Care | Payer: Self-pay | Source: Home / Self Care | Attending: Urology

## 2020-07-11 ENCOUNTER — Ambulatory Visit (HOSPITAL_COMMUNITY)
Admission: RE | Admit: 2020-07-11 | Discharge: 2020-07-11 | Disposition: A | Discharge status: Home / Self Care | Payer: BC Managed Care – PPO | Attending: Urology | Admitting: Urology

## 2020-07-11 ENCOUNTER — Ambulatory Visit (HOSPITAL_COMMUNITY): Payer: BC Managed Care – PPO

## 2020-07-11 ENCOUNTER — Ambulatory Visit (HOSPITAL_COMMUNITY): Payer: BC Managed Care – PPO | Admitting: Certified Registered Nurse Anesthetist

## 2020-07-11 ENCOUNTER — Other Ambulatory Visit: Payer: Self-pay

## 2020-07-11 DIAGNOSIS — Z20822 Contact with and (suspected) exposure to covid-19: Secondary | ICD-10-CM | POA: Diagnosis not present

## 2020-07-11 DIAGNOSIS — Z87442 Personal history of urinary calculi: Secondary | ICD-10-CM | POA: Insufficient documentation

## 2020-07-11 DIAGNOSIS — Z91030 Bee allergy status: Secondary | ICD-10-CM | POA: Insufficient documentation

## 2020-07-11 DIAGNOSIS — Z882 Allergy status to sulfonamides status: Secondary | ICD-10-CM | POA: Diagnosis not present

## 2020-07-11 DIAGNOSIS — N132 Hydronephrosis with renal and ureteral calculous obstruction: Secondary | ICD-10-CM | POA: Diagnosis not present

## 2020-07-11 DIAGNOSIS — Z87891 Personal history of nicotine dependence: Secondary | ICD-10-CM | POA: Insufficient documentation

## 2020-07-11 HISTORY — PX: CYSTOSCOPY WITH RETROGRADE PYELOGRAM, URETEROSCOPY AND STENT PLACEMENT: SHX5789

## 2020-07-11 HISTORY — PX: HOLMIUM LASER APPLICATION: SHX5852

## 2020-07-11 HISTORY — DX: Personal history of urinary calculi: Z87.442

## 2020-07-11 LAB — BASIC METABOLIC PANEL
Anion gap: 7 (ref 5–15)
BUN: 14 mg/dL (ref 6–20)
CO2: 26 mmol/L (ref 22–32)
Calcium: 10.2 mg/dL (ref 8.9–10.3)
Chloride: 108 mmol/L (ref 98–111)
Creatinine, Ser: 1 mg/dL (ref 0.61–1.24)
GFR calc Af Amer: >60 mL/min (ref >60)
GFR calc non Af Amer: >60 mL/min (ref >60)
Glucose, Bld: 91 mg/dL (ref 70–99)
Potassium: 4 mmol/L (ref 3.5–5.1)
Sodium: 141 mmol/L (ref 135–145)

## 2020-07-11 LAB — CBC
HCT: 46 % (ref 39.0–52.0)
Hemoglobin: 15.3 g/dL (ref 13.0–17.0)
MCH: 30.5 pg (ref 26.0–34.0)
MCHC: 33.3 g/dL (ref 30.0–36.0)
MCV: 91.6 fL (ref 80.0–100.0)
Platelets: 259 10*3/uL (ref 150–400)
RBC: 5.02 MIL/uL (ref 4.22–5.81)
RDW: 13 % (ref 11.5–15.5)
WBC: 5.8 10*3/uL (ref 4.0–10.5)
nRBC: 0 % (ref 0.0–0.2)

## 2020-07-11 LAB — SARS CORONAVIRUS 2 BY RT PCR (HOSPITAL ORDER, PERFORMED IN ~~LOC~~ HOSPITAL LAB): SARS Coronavirus 2: NEGATIVE

## 2020-07-11 SURGERY — CYSTOURETEROSCOPY, WITH RETROGRADE PYELOGRAM AND STENT INSERTION
Anesthesia: General | Laterality: Left

## 2020-07-11 MED ORDER — DEXAMETHASONE SODIUM PHOSPHATE 10 MG/ML IJ SOLN
INTRAMUSCULAR | Status: DC | PRN
Start: 2020-07-11 — End: 2020-07-11
  Administered 2020-07-11: 10 mg via INTRAVENOUS

## 2020-07-11 MED ORDER — FENTANYL CITRATE (PF) 100 MCG/2ML IJ SOLN
INTRAMUSCULAR | Status: AC
  Filled 2020-07-11: qty 2

## 2020-07-11 MED ORDER — PROPOFOL 10 MG/ML IV BOLUS
INTRAVENOUS | Status: AC
  Filled 2020-07-11: qty 20

## 2020-07-11 MED ORDER — SODIUM CHLORIDE 0.9 % IR SOLN
Status: DC | PRN
  Administered 2020-07-11: 3000 mL

## 2020-07-11 MED ORDER — MIDAZOLAM HCL 2 MG/2ML IJ SOLN
INTRAMUSCULAR | Status: DC | PRN
  Administered 2020-07-11: 2 mg via INTRAVENOUS

## 2020-07-11 MED ORDER — CHLORHEXIDINE GLUCONATE 0.12 % MT SOLN
15.0000 mL | Freq: Once | OROMUCOSAL | Status: AC
  Administered 2020-07-11: 15 mL via OROMUCOSAL

## 2020-07-11 MED ORDER — IOHEXOL 300 MG/ML  SOLN
INTRAMUSCULAR | Status: DC | PRN
  Administered 2020-07-11: 10 mL

## 2020-07-11 MED ORDER — KETOROLAC TROMETHAMINE 10 MG PO TABS: 10.0000 mg | ORAL_TABLET | Freq: Three times a day (TID) | ORAL | 0 refills | Status: DC | PRN

## 2020-07-11 MED ORDER — CEFAZOLIN SODIUM-DEXTROSE 2-4 GM/100ML-% IV SOLN
2.0000 g | INTRAVENOUS | Status: AC
  Administered 2020-07-11: 2 g via INTRAVENOUS
  Filled 2020-07-11: qty 100

## 2020-07-11 MED ORDER — ORAL CARE MOUTH RINSE: 15.0000 mL | Freq: Once | OROMUCOSAL | Status: AC

## 2020-07-11 MED ORDER — ONDANSETRON HCL 4 MG/2ML IJ SOLN
INTRAMUSCULAR | Status: DC | PRN
  Administered 2020-07-11: 4 mg via INTRAVENOUS

## 2020-07-11 MED ORDER — OXYCODONE HCL 5 MG/5ML PO SOLN: 5.0000 mg | Freq: Once | ORAL | Status: DC | PRN

## 2020-07-11 MED ORDER — FENTANYL CITRATE (PF) 250 MCG/5ML IJ SOLN
INTRAMUSCULAR | Status: DC | PRN
  Administered 2020-07-11 (×4): 50 ug via INTRAVENOUS

## 2020-07-11 MED ORDER — OXYCODONE HCL 5 MG PO TABS: 5.0000 mg | ORAL_TABLET | Freq: Once | ORAL | Status: DC | PRN

## 2020-07-11 MED ORDER — LIDOCAINE 2% (20 MG/ML) 5 ML SYRINGE
INTRAMUSCULAR | Status: DC | PRN
  Administered 2020-07-11: 60 mg via INTRAVENOUS

## 2020-07-11 MED ORDER — ONDANSETRON HCL 4 MG/2ML IJ SOLN: 4.0000 mg | Freq: Four times a day (QID) | INTRAMUSCULAR | Status: DC | PRN

## 2020-07-11 MED ORDER — OXYCODONE-ACETAMINOPHEN 5-325 MG PO TABS: 1.0000 | ORAL_TABLET | Freq: Four times a day (QID) | ORAL | 0 refills | Status: DC | PRN

## 2020-07-11 MED ORDER — LACTATED RINGERS IV SOLN: INTRAVENOUS | Status: DC

## 2020-07-11 MED ORDER — MIDAZOLAM HCL 2 MG/2ML IJ SOLN
INTRAMUSCULAR | Status: AC
  Filled 2020-07-11: qty 2

## 2020-07-11 MED ORDER — CEPHALEXIN 500 MG PO CAPS: 500.0000 mg | ORAL_CAPSULE | Freq: Two times a day (BID) | ORAL | 0 refills | Status: AC

## 2020-07-11 MED ORDER — PROPOFOL 10 MG/ML IV BOLUS
INTRAVENOUS | Status: DC | PRN
  Administered 2020-07-11: 30 mg via INTRAVENOUS
  Administered 2020-07-11: 50 mg via INTRAVENOUS
  Administered 2020-07-11: 20 mg via INTRAVENOUS
  Administered 2020-07-11: 200 mg via INTRAVENOUS

## 2020-07-11 MED ORDER — FENTANYL CITRATE (PF) 100 MCG/2ML IJ SOLN: 25.0000 ug | INTRAMUSCULAR | Status: DC | PRN

## 2020-07-11 MED ORDER — SENNOSIDES-DOCUSATE SODIUM 8.6-50 MG PO TABS: 1.0000 | ORAL_TABLET | Freq: Two times a day (BID) | ORAL | 0 refills | Status: AC

## 2020-07-11 SURGICAL SUPPLY — 24 items
BAG URO CATCHER STRL LF (MISCELLANEOUS) ×3 IMPLANT
BASKET LASER NITINOL 1.9FR (BASKET) ×2 IMPLANT
BSKT STON RTRVL 120 1.9FR (BASKET) ×1
CATH INTERMIT  6FR 70CM (CATHETERS) ×3 IMPLANT
CLOTH BEACON ORANGE TIMEOUT ST (SAFETY) ×3 IMPLANT
EXTRACTOR STONE 1.7FRX115CM (UROLOGICAL SUPPLIES) IMPLANT
FIBER LASER FLEXIVA 1000 (UROLOGICAL SUPPLIES) IMPLANT
FIBER LASER FLEXIVA 365 (UROLOGICAL SUPPLIES) IMPLANT
FIBER LASER FLEXIVA 550 (UROLOGICAL SUPPLIES) IMPLANT
GLOVE BIOGEL M STRL SZ7.5 (GLOVE) ×3 IMPLANT
GOWN STRL REUS W/TWL LRG LVL3 (GOWN DISPOSABLE) ×3 IMPLANT
GUIDEWIRE ANG ZIPWIRE 038X150 (WIRE) ×3 IMPLANT
GUIDEWIRE STR DUAL SENSOR (WIRE) ×3 IMPLANT
KIT TURNOVER KIT A (KITS) IMPLANT
LASER FIBER FLEXIV TRACTIP 200 (Laser) ×2 IMPLANT
MANIFOLD NEPTUNE II (INSTRUMENTS) ×3 IMPLANT
PACK CYSTO (CUSTOM PROCEDURE TRAY) ×3 IMPLANT
SHEATH URETERAL 12FRX28CM (UROLOGICAL SUPPLIES) IMPLANT
SHEATH URETERAL 12FRX35CM (MISCELLANEOUS) IMPLANT
STENT POLARIS 5FRX26 (STENTS) ×2 IMPLANT
TUBE FEEDING 8FR 16IN STR KANG (MISCELLANEOUS) ×3 IMPLANT
TUBING CONNECTING 10 (TUBING) ×2 IMPLANT
TUBING CONNECTING 10' (TUBING) ×1
TUBING UROLOGY SET (TUBING) ×3 IMPLANT

## 2020-07-11 NOTE — Anesthesia Procedure Notes (Signed)
Date/Time: 07/11/2020 4:59 PM Performed by: Minerva Ends, CRNA Oxygen Delivery Method: Simple face mask Placement Confirmation: positive ETCO2 and breath sounds checked- equal and bilateral Dental Injury: Teeth and Oropharynx as per pre-operative assessment

## 2020-07-11 NOTE — Anesthesia Preprocedure Evaluation (Signed)
Anesthesia Evaluation  Patient identified by MRN, date of birth, ID band Patient awake    Reviewed: Allergy & Precautions, H&P , NPO status , Patient's Chart, lab work & pertinent test results  Airway Mallampati: II   Neck ROM: full    Dental   Pulmonary former smoker,    breath sounds clear to auscultation       Cardiovascular negative cardio ROS   Rhythm:regular Rate:Normal     Neuro/Psych    GI/Hepatic   Endo/Other    Renal/GU stones     Musculoskeletal   Abdominal   Peds  Hematology   Anesthesia Other Findings   Reproductive/Obstetrics                             Anesthesia Physical Anesthesia Plan  ASA: II  Anesthesia Plan: General   Post-op Pain Management:    Induction: Intravenous  PONV Risk Score and Plan: 2 and Ondansetron, Dexamethasone, Midazolam and Treatment may vary due to age or medical condition  Airway Management Planned: LMA  Additional Equipment:   Intra-op Plan:   Post-operative Plan: Extubation in OR  Informed Consent: I have reviewed the patients History and Physical, chart, labs and discussed the procedure including the risks, benefits and alternatives for the proposed anesthesia with the patient or authorized representative who has indicated his/her understanding and acceptance.       Plan Discussed with: CRNA, Anesthesiologist and Surgeon  Anesthesia Plan Comments:         Anesthesia Quick Evaluation

## 2020-07-11 NOTE — Discharge Instructions (Signed)
1 - You may have urinary urgency (bladder spasms) and bloody urine on / off with stent in place. This is normal.  2 - Remove tethered stent on Monday morning at home by pulling string, then blue-white plastic tubing, and discarding. Office is open Monday if any problems arise.   3 - Call MD or go to ER for fever >102, severe pain / nausea / vomiting not relieved by medications, or acute change in medical status  

## 2020-07-11 NOTE — Brief Op Note (Signed)
07/11/2020  4:53 PM  PATIENT:  Kyle Ellis  39 y.o. male  PRE-OPERATIVE DIAGNOSIS:  LEFT URETERAL CALCULI  POST-OPERATIVE DIAGNOSIS:  LEFT URETERAL CALCULI  PROCEDURE:  Procedure(s) with comments: CYSTOSCOPY WITH RETROGRADE PYELOGRAM, URETEROSCOPY AND STENT PLACEMENT (Left) - 1 HR HOLMIUM LASER APPLICATION (Left)  SURGEON:  Surgeon(s) and Role:    Sebastian Ache, MD - Primary  PHYSICIAN ASSISTANT:   ASSISTANTS: none   ANESTHESIA:   general  EBL:  minimal   BLOOD ADMINISTERED:none  DRAINS: none   LOCAL MEDICATIONS USED:  NONE  SPECIMEN:  Source of Specimen:  left ureteral stone fragments  DISPOSITION OF SPECIMEN:  [facility] for compositional analysis  COUNTS:  YES  TOURNIQUET:  * No tourniquets in log *  DICTATION: .Other Dictation: Dictation Number [id number]  PLAN OF CARE: Discharge to home after PACU  PATIENT DISPOSITION:  PACU - hemodynamically stable.   Delay start of Pharmacological VTE agent (>24hrs) due to surgical blood loss or risk of bleeding: yes

## 2020-07-11 NOTE — H&P (Signed)
Kyle Ellis is an 39 y.o. male.    Chief Complaint: Pre-Op LEFT Ureteroscopic Stone Manipulation  HPI:   1 - Left Ureteral Stone - 31mm fusiform left mid/distal stone (above iliacs) by CT 06/2020. No passage on few weeks of medical therapy. UCX negative. Stone is solitary. Colic becoming more difficult to control.  Today "Kyle Ellis" is seen to proceed with LEFT ureteroscopy for distal / mid stone that has failed medical therapy. No interval fevers.   Past Medical History:  Diagnosis Date  . History of kidney stones     Past Surgical History:  Procedure Laterality Date  . HERNIA REPAIR     20 years ago  . piece of spine removed      2019 - done at surgical center in [place] by dr elsner     No family history on file. Social History:  reports that he quit smoking about 2 years ago. He has never used smokeless tobacco. He reports current alcohol use. He reports that he does not use drugs.  Allergies:  Allergies  Allergen Reactions  . Bee Venom Swelling  . Sulfa Antibiotics Hives and Swelling    No medications prior to admission.    No results found for this or any previous visit (from the past 48 hour(s)). No results found.  Review of Systems  Constitutional: Negative for chills.  Genitourinary: Positive for flank pain.  All other systems reviewed and are negative.   Height 6\' 3"  (1.905 m), weight (!) 104.3 kg. Physical Exam Vitals reviewed.  HENT:     Head: Normocephalic.     Nose: Nose normal.     Mouth/Throat:     Mouth: Mucous membranes are moist.  Eyes:     Pupils: Pupils are equal, round, and reactive to light.  Cardiovascular:     Rate and Rhythm: Normal rate.  Pulmonary:     Effort: Pulmonary effort is normal.  Abdominal:     General: Abdomen is flat.  Genitourinary:    Comments: Mild left CVAT at present.  Musculoskeletal:        General: Normal range of motion.     Cervical back: Normal range of motion.  Skin:    General: Skin is warm.  Neurological:      General: No focal deficit present.     Mental Status: He is alert.  Psychiatric:        Mood and Affect: Mood normal.      Assessment/Plan  Proceed as planned with LEFT ureteroscopy with goal of stone free. Risks, benefits, alternatives, expected peri-op course discussed previosly and reiterated today.   , MD 07/11/2020, 2:12 PM

## 2020-07-11 NOTE — Anesthesia Procedure Notes (Signed)
Procedure Name: LMA Insertion Date/Time: 07/11/2020 4:23 PM Performed by: Jorene Minors, CRNA Pre-anesthesia Checklist: Patient identified, Emergency Drugs available, Suction available and Patient being monitored Patient Re-evaluated:Patient Re-evaluated prior to induction Oxygen Delivery Method: Circle system utilized Preoxygenation: Pre-oxygenation with 100% oxygen Induction Type: IV induction LMA: LMA inserted LMA Size: 5.0 Tube type: Oral Number of attempts: 1 Placement Confirmation: positive ETCO2 and breath sounds checked- equal and bilateral Tube secured with: Tape Dental Injury: Teeth and Oropharynx as per pre-operative assessment

## 2020-07-11 NOTE — Transfer of Care (Signed)
Immediate Anesthesia Transfer of Care Note  Patient: Kyle Ellis  Procedure(s) Performed: CYSTOSCOPY WITH RETROGRADE PYELOGRAM, URETEROSCOPY AND STENT PLACEMENT (Left ) HOLMIUM LASER APPLICATION (Left )  Patient Location: PACU  Anesthesia Type:General  Level of Consciousness: awake and alert   Airway & Oxygen Therapy: Patient Spontanous Breathing and Patient connected to face mask oxygen  Post-op Assessment: Report given to RN and Post -op Vital signs reviewed and stable  Post vital signs: Reviewed and stable  Last Vitals:  Vitals Value Taken Time  BP 135/87 07/11/20 1708  Temp    Pulse 66 07/11/20 1710  Resp 12 07/11/20 1710  SpO2 100 % 07/11/20 1710  Vitals shown include unvalidated device data.  Last Pain:  Vitals:   07/11/20 1443  TempSrc: Oral         Complications: No complications documented.

## 2020-07-11 NOTE — Op Note (Signed)
NAME: Kyle, Ellis MEDICAL RECORD [id number] ACCOUNT [id number] DATE OF BIRTH:[birth year] FACILITY: [facility] LOCATION: [facility] PHYSICIAN:Bhavesh Vazquez Berneice Heinrich, MD  OPERATIVE REPORT  DATE OF PROCEDURE:  07/11/2020  SURGEON:  Sebastian Ache MD  PREOPERATIVE DIAGNOSIS:  Left ureteral stone.  PROCEDURE: 1.  Cystoscopy, left retrograde pyelogram, interpretation. 2.  Left ureteroscopy with laser lithotripsy. 3.  Insertion of left ureteral stent, 5 x 6 Polaris with tether.  ESTIMATED BLOOD LOSS:  Nil.  COMPLICATIONS:  None.  SPECIMENS:  Left ureteral stone fragments for composition analysis.  FINDINGS: 1.  Left distal ureteral stone with mild hydronephrosis. 2.  Complete resolution of all accessible stone fragments larger than one-third mm within the left ureter. 3.  Successful placement of left ureteral stent, proximal end in renal pelvis, distal end in urinary bladder.  INDICATIONS:  The patient is a 39 year old man with a recent diagnosis of left ureteral stone approximately 2 weeks ago.  He was appropriately placed on medical therapy, but he has failed to pass the stone.  His colic has become difficult to control.  He  was seen again in the office yesterday and was found to have nonpassage of the stone.  Options were discussed for management and he wished to proceed with ureteroscopy next available.  Informed consent was obtained and placed in the medical record.  DESCRIPTION OF PROCEDURE:  The patient being identified, the procedure being left ureteroscopic stone manipulation was confirmed.  Procedure timeout was performed.  Intravenous antibiotics administered.  General anesthesia induced.  The patient was  placed into a low lithotomy position, sterile field was created, prepped and draped the patient's penis, perineum and proximal thighs using iodine.  Cystourethroscopy was then performed using a 21-French rigid cystoscope with offset lens.  Inspection of  anterior and posterior  urethra were unremarkable.  Inspection of the urinary bladder revealed no diverticula, calcifications, papillary lesions.  Ureteral orifices were singleton.  The left ureteral orifice was cannulated with a 6-French renal catheter  and left retrograde pyelogram was obtained.  Left retrograde pyelogram demonstrated a single left ureter, a single system left kidney.  There was a filling defect in distal ureter just above the intramural ureter consistent with known stone.  There was mild hydronephrosis above this.  A 0.03  ZIPwire was advanced to lower pole and set aside as a safety wire.  An 8-French feeding tube was placed in the urinary bladder for pressure release and semirigid ureteroscopy performed the distal left ureter alongside a separate sensor working wire.  As  expected, just above the level of the intramural ureter was a single dominant stone.  This was much too large for simple basketing.  As such, holmium laser energy was applied to the stone using settings of 0.2 joules and 20 Hz.  The stone was fragmented  into approximately 4 smaller pieces that were then sequentially grasped in an Escape basket and brought to the level of the urinary bladder.  The remainder of the distal four-fifths of the left ureter revealed no additional stone fragments and it was  corroborated that he had a solitary stone, thus rendered now stone free.  The intramural ureter was somewhat tight.  The cystoscope was used to irrigate the small stone fragments in the bladder.  They were set aside for composition analysis.  A 5 x 26  Polaris-type stent was placed over remaining safety wire using fluoroscopic guidance.  Good proximal and distal planes were noted.  Tether was left in place and fashioned to the dorsum of  the penis.  The procedure was terminated.  The patient tolerated  the procedure well.  No immediate perioperative complications.  The patient was taken to postanesthesia care in stable condition.  Plan for  discharge home.  VN/NUANCE  D:07/11/2020 T:07/11/2020 JOB:[id number]

## 2020-07-14 ENCOUNTER — Encounter (HOSPITAL_COMMUNITY): Payer: Self-pay | Admitting: Urology

## 2020-07-15 NOTE — Anesthesia Postprocedure Evaluation (Signed)
Anesthesia Post Note  Patient: Kyle Ellis  Procedure(s) Performed: CYSTOSCOPY WITH RETROGRADE PYELOGRAM, URETEROSCOPY AND STENT PLACEMENT (Left ) HOLMIUM LASER APPLICATION (Left )     Patient location during evaluation: PACU Anesthesia Type: General Level of consciousness: awake and alert Pain management: pain level controlled Vital Signs Assessment: post-procedure vital signs reviewed and stable Respiratory status: spontaneous breathing, nonlabored ventilation, respiratory function stable and patient connected to nasal cannula oxygen Cardiovascular status: blood pressure returned to baseline and stable Postop Assessment: no apparent nausea or vomiting Anesthetic complications: no   No complications documented.  Last Vitals:  Vitals:   07/11/20 1730 07/11/20 1822  BP: 128/82 (!) 137/94  Pulse: 71 70  Resp: 12 14  Temp: 36.4 C 36.5 C  SpO2: 97% 98%    Last Pain:  Vitals:   07/11/20 1822  TempSrc:   PainSc: 0-No pain                 Martie Muhlbauer S

## 2021-12-13 IMAGING — CT CT RENAL STONE PROTOCOL
2 of 4 series · 16 of 46 positions shown, 18 images · non-contrast
Comparison: CT abdomen pelvis 06/15/2009

CLINICAL DATA: Left flank pain since [REDACTED]. Hematuria since
[REDACTED]. History of kidney stone

EXAM:
CT ABDOMEN AND PELVIS WITHOUT CONTRAST
TECHNIQUE: Multidetector CT imaging of the abdomen and pelvis was performed
following the standard protocol without IV contrast.

[Series 3: renal stone 5.0 · axial · 0.86mm/px · z∈[-497,-37]mm · 13 of 102 slices shown, 15 images]
[im 5/102  soft-tissue]
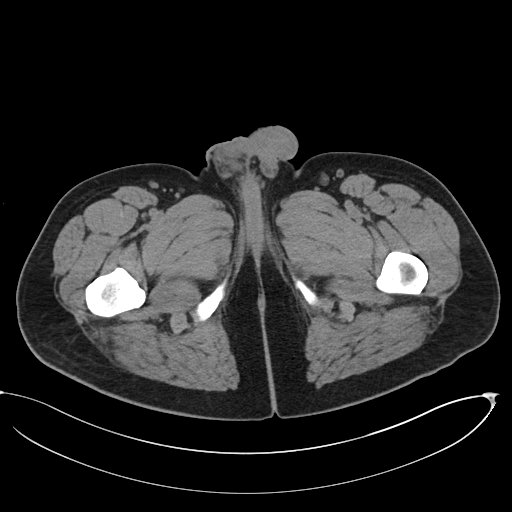
[im 5/102  bone]
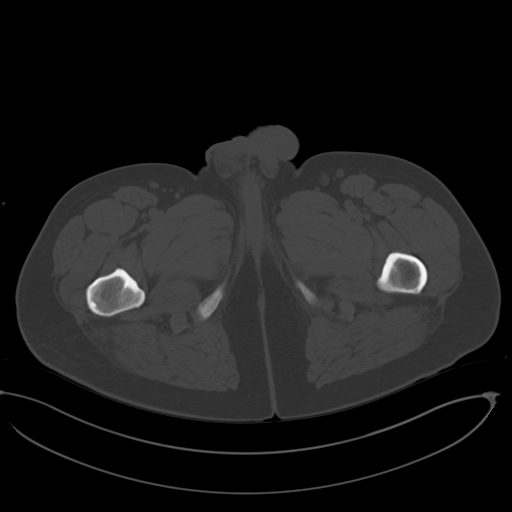
[im 13/102  soft-tissue]
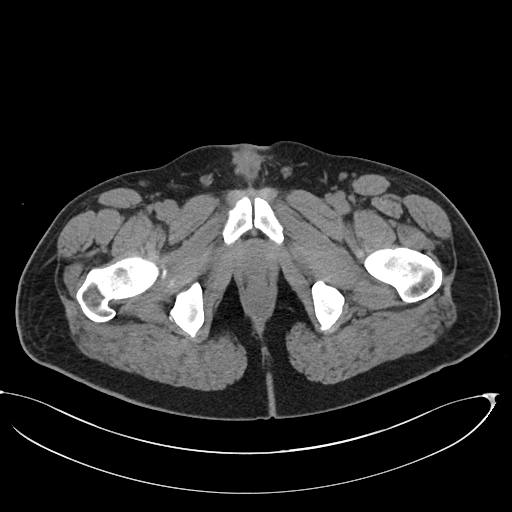
[im 22/102  soft-tissue]
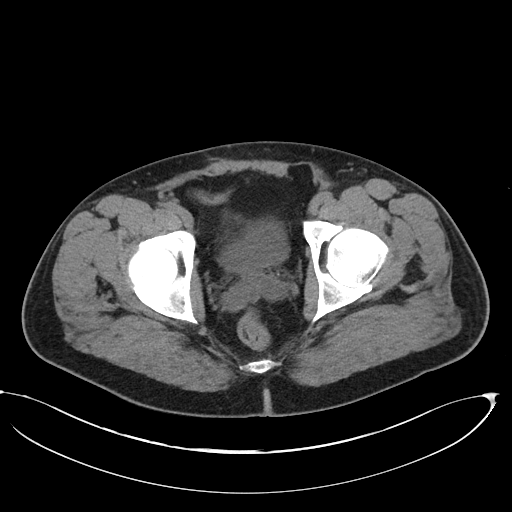
[im 30/102  soft-tissue]
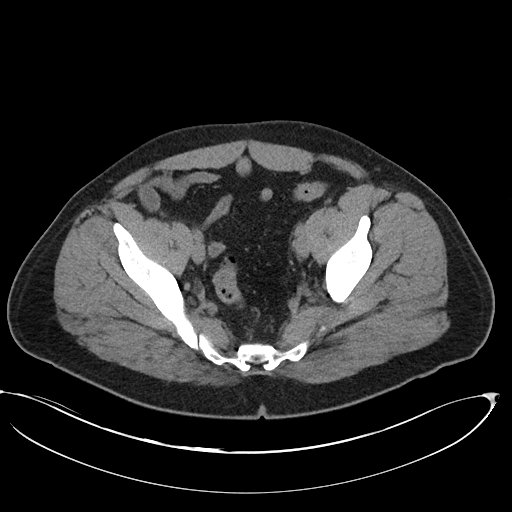
[im 34/102  soft-tissue]
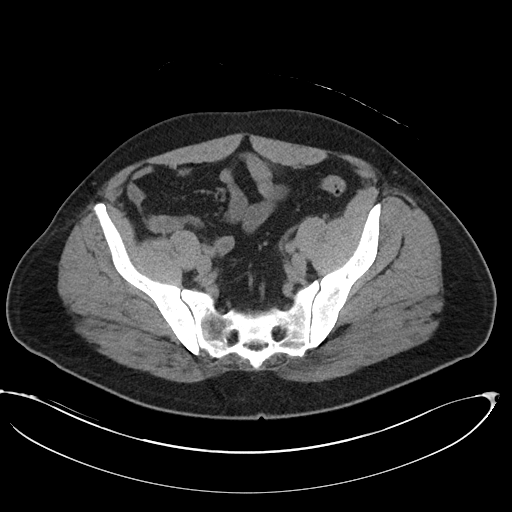
[im 43/102  soft-tissue]
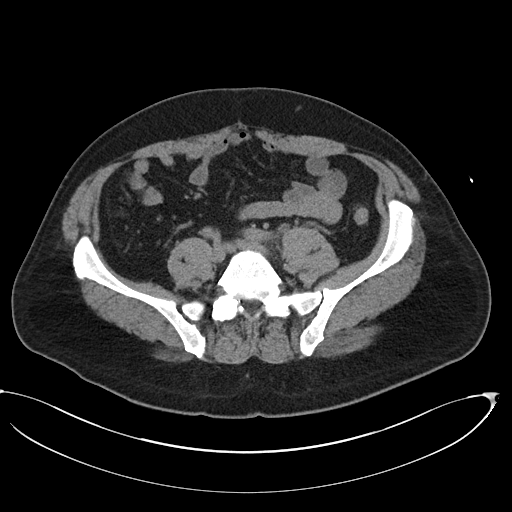
[im 51/102  soft-tissue]
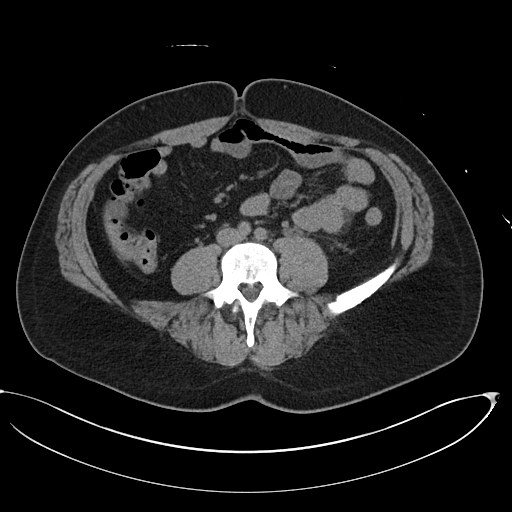
[im 59/102  soft-tissue]
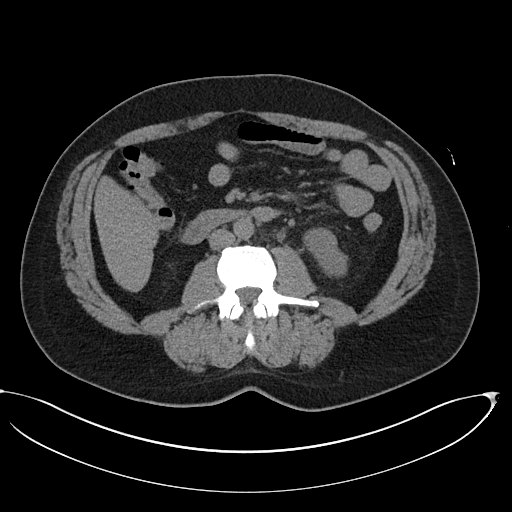
[im 68/102  soft-tissue]
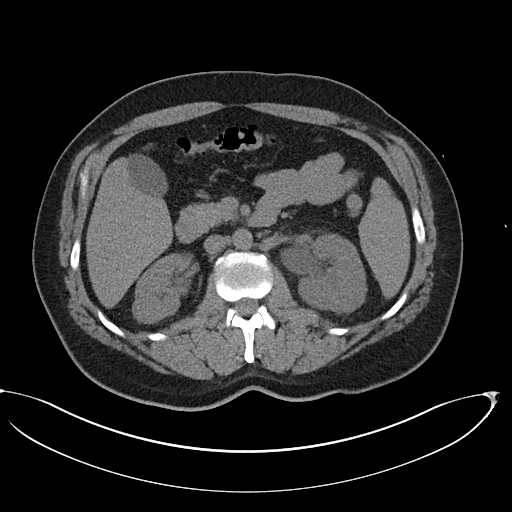
[im 68/102  bone]
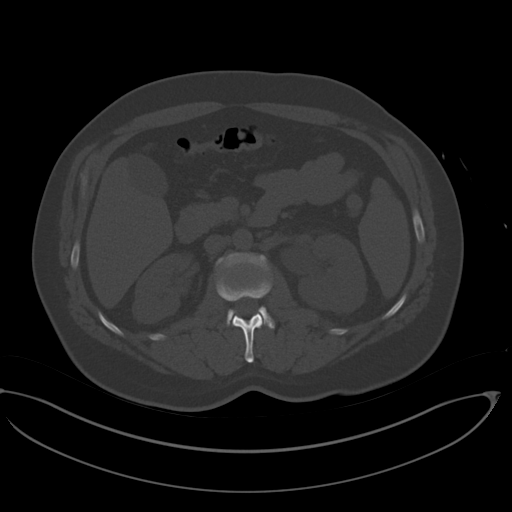
[im 72/102  soft-tissue]
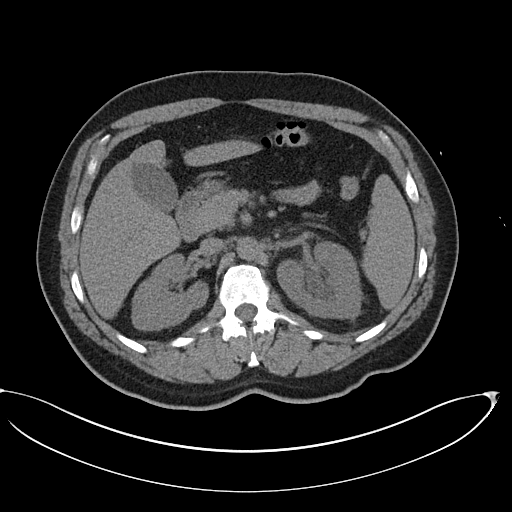
[im 80/102  soft-tissue]
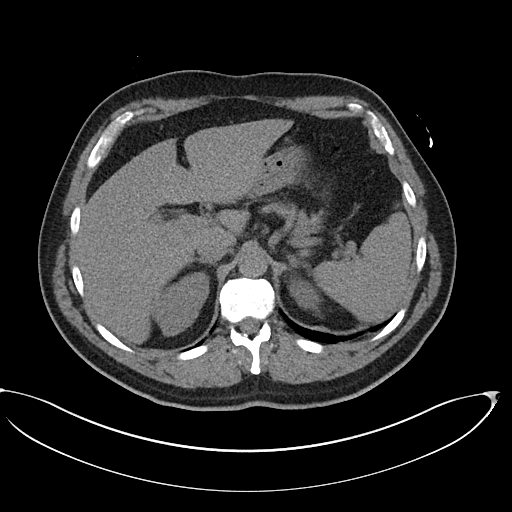
[im 89/102  soft-tissue]
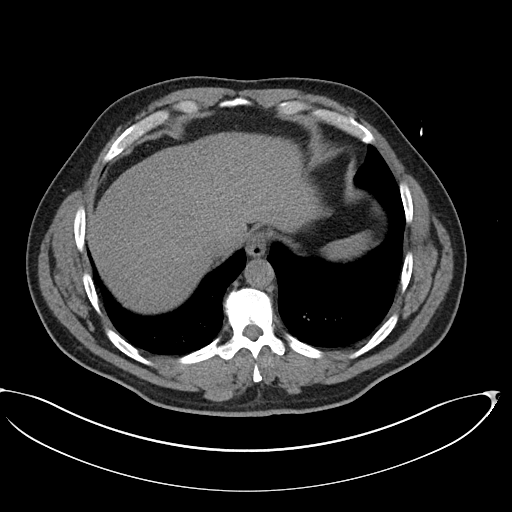
[im 97/102  soft-tissue]
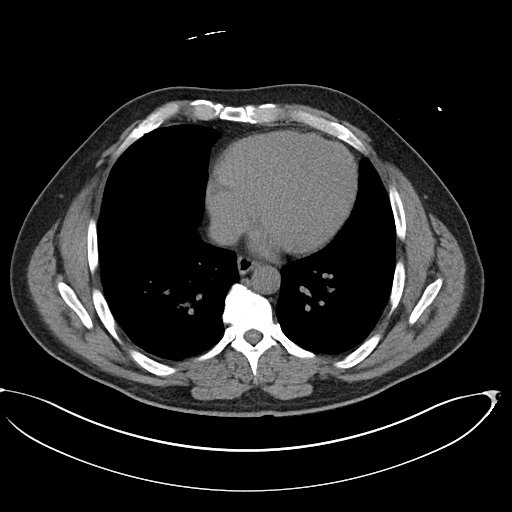

[Series 6: coronal · coronal · 0.80mm/px · 3 of 88 slices shown]
[im 30/88  soft-tissue]
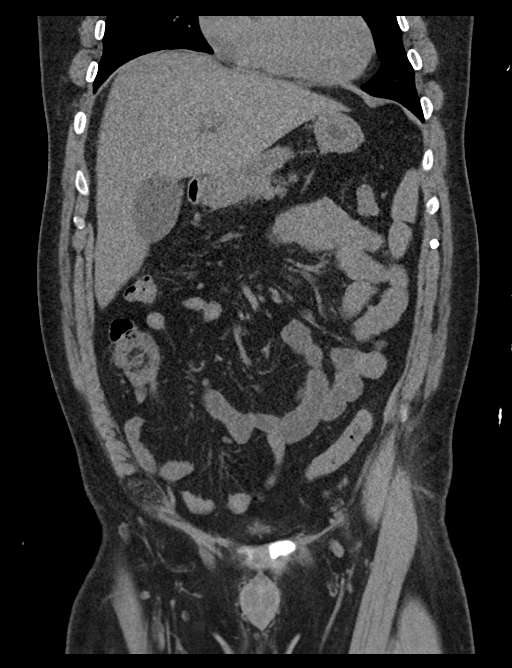
[im 39/88  soft-tissue]
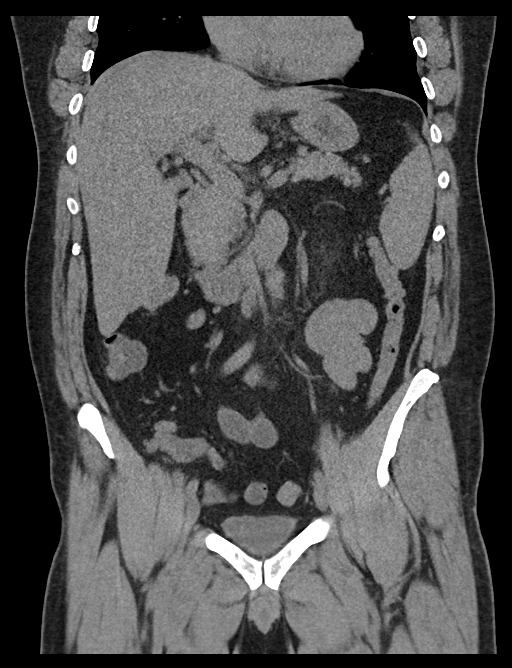
[im 49/88  soft-tissue]
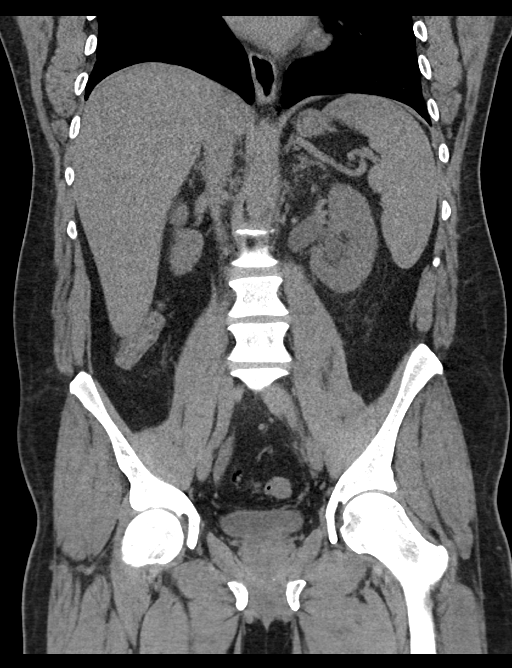

[16 of 46 positions shown; findings below may reference images not displayed]

FINDINGS: Lower chest: Minimal bibasilar atelectasis.

Evaluation of the abdominal viscera is somewhat limited by the lack
of IV contrast.

Hepatobiliary: No focal liver abnormality is seen. No gallstones,
gallbladder wall thickening, or biliary dilatation.

Pancreas: Unremarkable. No surrounding inflammatory changes.

Spleen: Normal in size without focal abnormality.

Adrenals/Urinary Tract: Adrenal glands are unremarkable. Left kidney
is enlarged with perinephric stranding. There is moderate
hydronephrosis and ureterectasis secondary to an obstructing stone
in the distal left ureter measuring 5 mm. The right kidney is
unremarkable. Normal appearance of the urinary bladder.

Stomach/Bowel: Stomach is within normal limits. Appendix appears
normal. No evidence of bowel wall thickening, distention, or
inflammatory changes.

Vascular/Lymphatic: No significant vascular findings are present. No
enlarged abdominal or pelvic lymph nodes.

Reproductive: Prostate is unremarkable.

Other: No abdominal wall hernia or abnormality. No abdominopelvic
ascites.

Musculoskeletal: No acute or significant osseous findings.
IMPRESSION: There is an obstructing 5 mm stone in the distal left ureter
resulting in moderate left hydronephrosis and ureterectasis as well
as edema of the left kidney.

## 2024-02-11 ENCOUNTER — Other Ambulatory Visit: Payer: Self-pay

## 2024-02-11 ENCOUNTER — Emergency Department (HOSPITAL_COMMUNITY)
Admission: EM | Admit: 2024-02-11 | Discharge: 2024-02-11 | Disposition: A | Discharge status: Home / Self Care | Payer: Medicaid Other | Attending: Emergency Medicine | Admitting: Emergency Medicine

## 2024-02-11 ENCOUNTER — Encounter (HOSPITAL_COMMUNITY): Payer: Self-pay

## 2024-02-11 DIAGNOSIS — R519 Headache, unspecified: Secondary | ICD-10-CM | POA: Diagnosis present

## 2024-02-11 DIAGNOSIS — R040 Epistaxis: Secondary | ICD-10-CM | POA: Insufficient documentation

## 2024-02-11 DIAGNOSIS — I10 Essential (primary) hypertension: Secondary | ICD-10-CM | POA: Diagnosis not present

## 2024-02-11 LAB — BASIC METABOLIC PANEL
Anion gap: 8 (ref 5–15)
BUN: 13 mg/dL (ref 6–20)
CO2: 27 mmol/L (ref 22–32)
Calcium: 10.7 mg/dL — ABNORMAL HIGH (ref 8.9–10.3)
Chloride: 104 mmol/L (ref 98–111)
Creatinine, Ser: 0.8 mg/dL (ref 0.61–1.24)
GFR, Estimated: >60 mL/min (ref >60)
Glucose, Bld: 104 mg/dL — ABNORMAL HIGH (ref 70–99)
Potassium: 5.2 mmol/L — ABNORMAL HIGH (ref 3.5–5.1)
Sodium: 139 mmol/L (ref 135–145)

## 2024-02-11 LAB — CBC WITH DIFFERENTIAL/PLATELET
Abs Immature Granulocytes: 0.02 10*3/uL (ref 0.00–0.07)
Basophils Absolute: 0 10*3/uL (ref 0.0–0.1)
Basophils Relative: 1 %
Eosinophils Absolute: 0.2 10*3/uL (ref 0.0–0.5)
Eosinophils Relative: 3 %
HCT: 48.1 % (ref 39.0–52.0)
Hemoglobin: 15.9 g/dL (ref 13.0–17.0)
Immature Granulocytes: 0 %
Lymphocytes Relative: 19 %
Lymphs Abs: 1.4 10*3/uL (ref 0.7–4.0)
MCH: 30.8 pg (ref 26.0–34.0)
MCHC: 33.1 g/dL (ref 30.0–36.0)
MCV: 93 fL (ref 80.0–100.0)
Monocytes Absolute: 0.5 10*3/uL (ref 0.1–1.0)
Monocytes Relative: 6 %
Neutro Abs: 5.3 10*3/uL (ref 1.7–7.7)
Neutrophils Relative %: 71 %
Platelets: 260 10*3/uL (ref 150–400)
RBC: 5.17 MIL/uL (ref 4.22–5.81)
RDW: 12.5 % (ref 11.5–15.5)
WBC: 7.5 10*3/uL (ref 4.0–10.5)
nRBC: 0 % (ref 0.0–0.2)

## 2024-02-11 NOTE — Discharge Instructions (Addendum)
 As we discussed, get a blood pressure cuff at any drugstore and measure your blood pressure twice a day, always at the same time of day. Record your measurements and share this information with your primary care doctor to determine whether treatment is indicated.

## 2024-02-11 NOTE — ED Triage Notes (Signed)
 Pt c/o rt side temporal headache coming and going this morning. Pt states he has hx of high blood pressure readings but has not been prescribed medication. Pt states this headache pain has started before with high blood pressure readings and will eventually start having nose bleeds. Pt was seen at [facility] for his elbow and they informed patient to come to ED for high blood pressure reading.

## 2024-02-11 NOTE — ED Provider Notes (Signed)
 [place] EMERGENCY DEPARTMENT AT [facility] Provider Note   CSN: [id number] Arrival date & time: 02/11/24  1202     History  Chief Complaint  Patient presents with   Headache    Kyle Ellis is a 43 y.o. male.  Patient to ED concerned for high blood pressure. He states strong family history in both parents. For the past as long as 8 years his pressure has been "borderline" high per PCP. Most recently has been a bit over normal and he has been experiencing headaches and nosebleeds. No headache currently. No vomiting, visual change.   The history is provided by the patient. No language interpreter was used.  Headache      Home Medications Prior to Admission medications   Medication Sig Start Date End Date Taking? Authorizing Provider  ketorolac (TORADOL) 10 MG tablet Take 1 tablet (10 mg total) by mouth every 8 (eight) hours as needed for moderate pain. Or stent discomfort post-operatively. 07/11/20   Loletta Parish., MD  oxyCODONE-acetaminophen (PERCOCET/ROXICET) 5-325 MG tablet Take 1 tablet by mouth every 6 (six) hours as needed for severe pain. Post-operatively 07/11/20   Loletta Parish., MD  senna-docusate (SENOKOT-S) 8.6-50 MG tablet Take 1 tablet by mouth 2 (two) times daily. While taking strong pain meds to prevent constipation. 07/11/20   Loletta Parish., MD  tamsulosin (FLOMAX) 0.4 MG CAPS capsule Take 1 capsule (0.4 mg total) by mouth daily. 04/19/20   Cathren Laine, MD      Allergies    Bee venom and Sulfa antibiotics    Review of Systems   Review of Systems  Neurological:  Positive for headaches.    Physical Exam Updated Vital Signs BP (!) 162/101 (BP Location: Left Arm)   Pulse 76   Temp 97.9 F (36.6 C) (Oral)   Resp 16   Ht 6\' 3"  (1.905 m)   Wt 73.5 kg   SpO2 99%   BMI 20.25 kg/m  Physical Exam Constitutional:      Appearance: He is well-developed.  HENT:     Head: Normocephalic.  Neck:     Vascular: No carotid  bruit.  Cardiovascular:     Rate and Rhythm: Normal rate and regular rhythm.     Heart sounds: No murmur heard. Pulmonary:     Effort: Pulmonary effort is normal.     Breath sounds: Normal breath sounds. No wheezing, rhonchi or rales.  Abdominal:     General: Bowel sounds are normal.     Palpations: Abdomen is soft.     Tenderness: There is no abdominal tenderness. There is no guarding or rebound.  Musculoskeletal:        General: Normal range of motion.     Cervical back: Normal range of motion and neck supple.     Right lower leg: No edema.     Left lower leg: No edema.  Skin:    General: Skin is warm and dry.  Neurological:     General: No focal deficit present.     Mental Status: He is alert and oriented to person, place, and time.     GCS: GCS eye subscore is 4. GCS verbal subscore is 5. GCS motor subscore is 6.     Cranial Nerves: No cranial nerve deficit.     Sensory: Sensation is intact.     Motor: Motor function is intact. No abnormal muscle tone.     ED Results / Procedures / Treatments  Labs (all labs ordered are listed, but only abnormal results are displayed) Labs Reviewed  BASIC METABOLIC PANEL - Abnormal; Notable for the following components:      Result Value   Potassium 5.2 (*)    Glucose, Bld 104 (*)    Calcium 10.7 (*)    All other components within normal limits  CBC WITH DIFFERENTIAL/PLATELET    EKG None  Radiology No results found.  Procedures Procedures    Medications Ordered in ED Medications - No data to display  ED Course/ Medical Decision Making/ A&P Clinical Course as of 02/11/24 1458  Sat Feb 11, 2024  1322 Blood pressure 162/101 on arrival, now 123/71 without intervention. Headache he had on arrival has resolved. Normal exam. Will check electrolytes, EKG. Consider referral to cardiology for blood pressure management.  [SU]  1452 Normal renal function, EKG, electrolytes. He remains asymptomatic. He will be referred back to PCP  for blood pressure management. Encouraged to obtain a cuff and check pressures at home, keep a log.  [SU]    Clinical Course User Index [SU] Elpidio Anis, PA-C                                 Medical Decision Making Amount and/or Complexity of Data Reviewed Labs: ordered.           Final Clinical Impression(s) / ED Diagnoses Final diagnoses:  Hypertension, unspecified type    Rx / DC Orders ED Discharge Orders     None         Danne Harbor 02/11/24 1458    Benjiman Core, MD 02/12/24 [phone]

## 2024-05-18 NOTE — H&P (Signed)
 PREOPERATIVE H&P  Chief Complaint: Right elbow infection  HPI: Kyle Ellis is a 43 y.o. male who presents for preoperative history and physical with a diagnosis of right septic olecranon bursitis.  He had an elbow aspiration done with Dr. Rendell Carrel back in February, this improved with oral antibiotics but never fully went away.  Over the last week it has gotten significantly worse.  He came in and was seen yesterday with Dr. Marland Silvas, had an aspiration, antibiotics were ordered, since that time he has had significant increasing in redness and swelling and has been tracking up his arm.  He has gotten the antibiotics filled but is only taken 1 dose.  He was also given intramuscular Rocephin.  Significant problems with elbow pain, he has difficulty moving it as well.  Past Medical History:  Diagnosis Date   History of kidney stones    Past Surgical History:  Procedure Laterality Date   CYSTOSCOPY WITH RETROGRADE PYELOGRAM, URETEROSCOPY AND STENT PLACEMENT Left 07/11/2020   Procedure: CYSTOSCOPY WITH RETROGRADE PYELOGRAM, URETEROSCOPY AND STENT PLACEMENT;  Surgeon: Osborn Blaze, MD;  Location: [facility];  Service: Urology;  Laterality: Left;  1 HR   ELBOW SURGERY Right    HERNIA REPAIR     20 years ago   HOLMIUM LASER APPLICATION Left 07/11/2020   Procedure: HOLMIUM LASER APPLICATION;  Surgeon: Osborn Blaze, MD;  Location: [facility];  Service: Urology;  Laterality: Left;   piece of spine removed      2019 - done at surgical center in [place] by dr elsner    Social History   Socioeconomic History   Marital status: Married    Spouse name: Not on file   Number of children: 6   Years of education: 14   Highest education level: Not on file  Occupational History   Not on file  Tobacco Use   Smoking status: Some Days    Current packs/day: 0.00    Types: Cigarettes, E-cigarettes    Last attempt to quit: 12/20/2017    Years since quitting: 6.4   Smokeless tobacco: Never  Vaping Use   Vaping  status: Some Days   Substances: Nicotine, Flavoring  Substance and Sexual Activity   Alcohol use: Yes    Comment: occ on friday nite    Drug use: No   Sexual activity: Not Currently  Other Topics Concern   Not on file  Social History Narrative   Not on file   Social Drivers of Health   Financial Resource Strain: Not on file  Food Insecurity: Not on file  Transportation Needs: Not on file  Physical Activity: Not on file  Stress: Not on file  Social Connections: Not on file   No family history on file. Allergies  Allergen Reactions   Bee Venom Swelling   Sulfa Antibiotics Hives and Swelling   Prior to Admission medications   Medication Sig Start Date End Date Taking? Authorizing Provider  ketorolac  (TORADOL ) 10 MG tablet Take 1 tablet (10 mg total) by mouth every 8 (eight) hours as needed for moderate pain. Or stent discomfort post-operatively. 07/11/20   Melody Spurling., MD  oxyCODONE -acetaminophen  (PERCOCET/ROXICET) 5-325 MG tablet Take 1 tablet by mouth every 6 (six) hours as needed for severe pain. Post-operatively 07/11/20   Melody Spurling., MD  senna-docusate (SENOKOT-S) 8.6-50 MG tablet Take 1 tablet by mouth 2 (two) times daily. While taking strong pain meds to prevent constipation. 07/11/20   Melody Spurling., MD  tamsulosin  (  FLOMAX ) 0.4 MG CAPS capsule Take 1 capsule (0.4 mg total) by mouth daily. 04/19/20   Guadalupe Lee, MD     Positive ROS: All other systems have been reviewed and were otherwise negative with the exception of those mentioned in the HPI and as above.  Physical Exam: General: Alert, no acute distress Cardiovascular: No pedal edema Respiratory: No cyanosis, no use of accessory musculature GI: No organomegaly, abdomen is soft and non-tender Skin: No lesions in the area of chief complaint Neurologic: Sensation intact distally Psychiatric: Patient is competent for consent with normal mood and affect Lymphatic: No axillary or cervical  lymphadenopathy  MUSCULOSKELETAL: Right elbow has a boggy olecranon bursa, there is not a huge amount of fluid accumulation but it is red and erythematous and tracking up his arm to the mid humerus level.  Positive pain to palpation that location range of motion is from 5 degrees to 120 degrees.  X-rays have a traction osteophyte on the olecranon.  Assessment: Septic right olecranon bursitis with some degree of cellulitis tracking up the arm   Plan: This is an acute significant problem, and he has risk for progression to osteomyelitis, as well as sepsis.  I recommended urgent surgical debridement.  We will plan to get him n.p.o. and plan for surgery tomorrow.  The risks benefits and alternatives were discussed with the patient including but not limited to the risks of nonoperative treatment, versus surgical intervention including infection, bleeding, nerve injury,  blood clots, cardiopulmonary complications, morbidity, mortality, among others, and they were willing to proceed.  We also discussed the risks of wound care, the potential need for repeat surgical intervention, among others.    Neville Barbone, MD Cell [phone]   05/18/2024 5:12 PM

## 2024-05-18 NOTE — Anesthesia Preprocedure Evaluation (Addendum)
 Anesthesia Evaluation  Patient identified by MRN, date of birth, ID band Patient awake    Reviewed: Allergy & Precautions, NPO status , Patient's Chart, lab work & pertinent test results  History of Anesthesia Complications Negative for: history of anesthetic complications  Airway Mallampati: II  TM Distance: >3 FB Neck ROM: Full    Dental no notable dental hx. (+) Teeth Intact, Dental Advisory Given   Pulmonary former smoker   Pulmonary exam normal breath sounds clear to auscultation       Cardiovascular (-) hypertension(-) angina (-) Past MI negative cardio ROS Normal cardiovascular exam Rhythm:Regular Rate:Normal     Neuro/Psych    GI/Hepatic Neg liver ROS,,,  Endo/Other  negative endocrine ROS    Renal/GU negative Renal ROS     Musculoskeletal negative musculoskeletal ROS (+)    Abdominal   Peds  Hematology negative hematology ROS (+)   Anesthesia Other Findings All: sulfa  Reproductive/Obstetrics                             Anesthesia Physical Anesthesia Plan  ASA: 2  Anesthesia Plan: General   Post-op Pain Management:    Induction: Intravenous  PONV Risk Score and Plan: 2 and Treatment may vary due to age or medical condition and Midazolam   Airway Management Planned: LMA  Additional Equipment: None  Intra-op Plan:   Post-operative Plan: Extubation in OR  Informed Consent: I have reviewed the patients History and Physical, chart, labs and discussed the procedure including the risks, benefits and alternatives for the proposed anesthesia with the patient or authorized representative who has indicated his/her understanding and acceptance.     Dental advisory given  Plan Discussed with: CRNA and Surgeon  Anesthesia Plan Comments:         Anesthesia Quick Evaluation

## 2024-05-19 ENCOUNTER — Ambulatory Visit (HOSPITAL_BASED_OUTPATIENT_CLINIC_OR_DEPARTMENT_OTHER): Payer: Self-pay | Admitting: Anesthesiology

## 2024-05-19 ENCOUNTER — Encounter (HOSPITAL_COMMUNITY)
Admission: RE | Disposition: A | Discharge status: Home / Self Care | Payer: Self-pay | Source: Home / Self Care | Attending: Orthopedic Surgery

## 2024-05-19 ENCOUNTER — Encounter (HOSPITAL_COMMUNITY): Payer: Self-pay | Admitting: Orthopedic Surgery

## 2024-05-19 ENCOUNTER — Ambulatory Visit (HOSPITAL_COMMUNITY): Payer: Self-pay | Admitting: Anesthesiology

## 2024-05-19 ENCOUNTER — Ambulatory Visit (HOSPITAL_COMMUNITY)
Admission: RE | Admit: 2024-05-19 | Discharge: 2024-05-19 | Disposition: A | Discharge status: Home / Self Care | Attending: Orthopedic Surgery | Admitting: Orthopedic Surgery

## 2024-05-19 DIAGNOSIS — F17210 Nicotine dependence, cigarettes, uncomplicated: Secondary | ICD-10-CM | POA: Diagnosis not present

## 2024-05-19 DIAGNOSIS — M71121 Other infective bursitis, right elbow: Secondary | ICD-10-CM | POA: Insufficient documentation

## 2024-05-19 DIAGNOSIS — Z79899 Other long term (current) drug therapy: Secondary | ICD-10-CM | POA: Diagnosis not present

## 2024-05-19 DIAGNOSIS — M71021 Abscess of bursa, right elbow: Secondary | ICD-10-CM | POA: Diagnosis present

## 2024-05-19 HISTORY — DX: Abscess of bursa, right elbow: M71.021

## 2024-05-19 HISTORY — PX: OLECRANON BURSECTOMY: SHX2097

## 2024-05-19 HISTORY — PX: INCISION AND DRAINAGE, BELOW ELBOW, BURSA: SHX7436

## 2024-05-19 SURGERY — BURSECTOMY, ELBOW
Anesthesia: General | Site: Elbow | Laterality: Right

## 2024-05-19 MED ORDER — LIDOCAINE HCL (PF) 2 % IJ SOLN
INTRAMUSCULAR | Status: AC
  Filled 2024-05-19: qty 5

## 2024-05-19 MED ORDER — OXYCODONE HCL 5 MG PO TABS: 5.0000 mg | ORAL_TABLET | Freq: Once | ORAL | Status: AC | PRN

## 2024-05-19 MED ORDER — HYDROMORPHONE HCL 1 MG/ML IJ SOLN: 0.2500 mg | INTRAMUSCULAR | Status: DC | PRN

## 2024-05-19 MED ORDER — ACETAMINOPHEN 10 MG/ML IV SOLN
INTRAVENOUS | Status: AC
  Filled 2024-05-19: qty 100

## 2024-05-19 MED ORDER — OXYCODONE HCL 5 MG/5ML PO SOLN
ORAL | Status: AC
  Filled 2024-05-19: qty 5

## 2024-05-19 MED ORDER — VANCOMYCIN HCL 1000 MG IV SOLR
INTRAVENOUS | Status: AC
  Filled 2024-05-19: qty 20

## 2024-05-19 MED ORDER — AMISULPRIDE (ANTIEMETIC) 5 MG/2ML IV SOLN: 10.0000 mg | Freq: Once | INTRAVENOUS | Status: DC | PRN

## 2024-05-19 MED ORDER — DOXYCYCLINE HYCLATE 50 MG PO CAPS: 50.0000 mg | ORAL_CAPSULE | Freq: Two times a day (BID) | ORAL | 0 refills | Status: AC

## 2024-05-19 MED ORDER — PROPOFOL 10 MG/ML IV BOLUS
INTRAVENOUS | Status: DC | PRN
  Administered 2024-05-19: 50 mg via INTRAVENOUS
  Administered 2024-05-19: 200 mg via INTRAVENOUS

## 2024-05-19 MED ORDER — DEXAMETHASONE SODIUM PHOSPHATE 10 MG/ML IJ SOLN
INTRAMUSCULAR | Status: AC
  Filled 2024-05-19: qty 1

## 2024-05-19 MED ORDER — ONDANSETRON HCL 4 MG/2ML IJ SOLN: 4.0000 mg | Freq: Once | INTRAMUSCULAR | Status: DC | PRN

## 2024-05-19 MED ORDER — ONDANSETRON HCL 4 MG/2ML IJ SOLN
INTRAMUSCULAR | Status: AC
  Filled 2024-05-19: qty 2

## 2024-05-19 MED ORDER — DEXTROSE 5 % IV SOLN
INTRAVENOUS | Status: DC | PRN
  Administered 2024-05-19: 3 g via INTRAVENOUS

## 2024-05-19 MED ORDER — LACTATED RINGERS IV SOLN: INTRAVENOUS | Status: DC | PRN

## 2024-05-19 MED ORDER — ONDANSETRON HCL 4 MG PO TABS: 4.0000 mg | ORAL_TABLET | Freq: Three times a day (TID) | ORAL | 0 refills | Status: AC | PRN

## 2024-05-19 MED ORDER — DEXMEDETOMIDINE HCL IN NACL 80 MCG/20ML IV SOLN
INTRAVENOUS | Status: DC | PRN
  Administered 2024-05-19: 12 ug via INTRAVENOUS

## 2024-05-19 MED ORDER — PROPOFOL 10 MG/ML IV BOLUS
INTRAVENOUS | Status: AC
  Filled 2024-05-19: qty 20

## 2024-05-19 MED ORDER — LIDOCAINE HCL (CARDIAC) PF 100 MG/5ML IV SOSY
PREFILLED_SYRINGE | INTRAVENOUS | Status: DC | PRN
Start: 2024-05-19 — End: 2024-05-19
  Administered 2024-05-19: 100 mg via INTRATRACHEAL

## 2024-05-19 MED ORDER — MIDAZOLAM HCL 2 MG/2ML IJ SOLN
INTRAMUSCULAR | Status: AC
  Filled 2024-05-19: qty 2

## 2024-05-19 MED ORDER — FENTANYL CITRATE (PF) 100 MCG/2ML IJ SOLN
INTRAMUSCULAR | Status: DC | PRN
  Administered 2024-05-19 (×2): 50 ug via INTRAVENOUS

## 2024-05-19 MED ORDER — VANCOMYCIN HCL 1000 MG IV SOLR
INTRAVENOUS | Status: DC | PRN
  Administered 2024-05-19: 1000 mg via INTRAVENOUS

## 2024-05-19 MED ORDER — OXYCODONE HCL 5 MG PO TABS: 5.0000 mg | ORAL_TABLET | ORAL | 0 refills | Status: AC | PRN

## 2024-05-19 MED ORDER — OXYCODONE HCL 5 MG/5ML PO SOLN
5.0000 mg | Freq: Once | ORAL | Status: AC | PRN
  Administered 2024-05-19: 5 mg via ORAL

## 2024-05-19 MED ORDER — PHENYLEPHRINE HCL (PRESSORS) 10 MG/ML IV SOLN
INTRAVENOUS | Status: AC
  Filled 2024-05-19: qty 1

## 2024-05-19 MED ORDER — DEXAMETHASONE SODIUM PHOSPHATE 10 MG/ML IJ SOLN
INTRAMUSCULAR | Status: DC | PRN
Start: 2024-05-19 — End: 2024-05-19
  Administered 2024-05-19: 8 mg via INTRAVENOUS

## 2024-05-19 MED ORDER — FENTANYL CITRATE (PF) 100 MCG/2ML IJ SOLN
INTRAMUSCULAR | Status: AC
  Filled 2024-05-19: qty 2

## 2024-05-19 MED ORDER — CEFAZOLIN SODIUM-DEXTROSE 2-4 GM/100ML-% IV SOLN
INTRAVENOUS | Status: AC
  Filled 2024-05-19: qty 100

## 2024-05-19 MED ORDER — DEXMEDETOMIDINE HCL IN NACL 80 MCG/20ML IV SOLN
INTRAVENOUS | Status: AC
  Filled 2024-05-19: qty 20

## 2024-05-19 MED ORDER — CEFAZOLIN SODIUM 1 G IJ SOLR
INTRAMUSCULAR | Status: AC
  Filled 2024-05-19: qty 20

## 2024-05-19 MED ORDER — ONDANSETRON HCL 4 MG/2ML IJ SOLN
INTRAMUSCULAR | Status: DC | PRN
  Administered 2024-05-19: 4 mg via INTRAVENOUS

## 2024-05-19 MED ORDER — LEVOFLOXACIN 750 MG PO TABS: 750.0000 mg | ORAL_TABLET | Freq: Every day | ORAL | 0 refills | Status: AC

## 2024-05-19 MED ORDER — ACETAMINOPHEN 10 MG/ML IV SOLN
1000.0000 mg | Freq: Once | INTRAVENOUS | Status: DC | PRN
  Administered 2024-05-19: 1000 mg via INTRAVENOUS

## 2024-05-19 MED ORDER — VANCOMYCIN HCL IN DEXTROSE 1-5 GM/200ML-% IV SOLN
INTRAVENOUS | Status: AC
  Filled 2024-05-19: qty 200

## 2024-05-19 MED ORDER — SODIUM CHLORIDE 0.9 % IR SOLN
Status: DC | PRN
  Administered 2024-05-19: 3000 mL

## 2024-05-19 SURGICAL SUPPLY — 32 items
BAG COUNTER SPONGE SURGICOUNT (BAG) IMPLANT
BNDG COHESIVE 4X5 TAN STRL LF (GAUZE/BANDAGES/DRESSINGS) ×2 IMPLANT
BNDG ELASTIC 4INX 5YD STR LF (GAUZE/BANDAGES/DRESSINGS) ×2 IMPLANT
BNDG ELASTIC 4X5.8 VLCR NS LF (GAUZE/BANDAGES/DRESSINGS) IMPLANT
BNDG ESMARK 4X9 LF (GAUZE/BANDAGES/DRESSINGS) ×2 IMPLANT
BNDG GAUZE DERMACEA FLUFF 4 (GAUZE/BANDAGES/DRESSINGS) IMPLANT
BOOTIES KNEE HIGH SLOAN (MISCELLANEOUS) ×4 IMPLANT
COVER SURGICAL LIGHT HANDLE (MISCELLANEOUS) ×2 IMPLANT
DRAPE POUCH INSTRU U-SHP 10X18 (DRAPES) ×2 IMPLANT
DURAPREP 26ML APPLICATOR (WOUND CARE) ×2 IMPLANT
ELECT REM PT RETURN 15FT ADLT (MISCELLANEOUS) ×2 IMPLANT
GAUZE PAD ABD 8X10 STRL (GAUZE/BANDAGES/DRESSINGS) ×4 IMPLANT
GAUZE SPONGE 4X4 12PLY STRL (GAUZE/BANDAGES/DRESSINGS) ×2 IMPLANT
GAUZE XEROFORM 1X8 LF (GAUZE/BANDAGES/DRESSINGS) ×2 IMPLANT
GLOVE BIO SURGEON STRL SZ7 (GLOVE) ×2 IMPLANT
GLOVE BIOGEL PI IND STRL 7.0 (GLOVE) ×2 IMPLANT
GLOVE BIOGEL PI IND STRL 8 (GLOVE) ×2 IMPLANT
GOWN STRL REUS W/ TWL LRG LVL3 (GOWN DISPOSABLE) ×4 IMPLANT
IV NS IRRIG 3000ML ARTHROMATIC (IV SOLUTION) ×4 IMPLANT
KIT BASIN OR (CUSTOM PROCEDURE TRAY) ×2 IMPLANT
KIT TURNOVER KIT A (KITS) IMPLANT
MANIFOLD NEPTUNE II (INSTRUMENTS) ×2 IMPLANT
NS IRRIG 1000ML POUR BTL (IV SOLUTION) ×2 IMPLANT
PACK ORTHO EXTREMITY (CUSTOM PROCEDURE TRAY) ×2 IMPLANT
PAD CAST 4YDX4 CTTN HI CHSV (CAST SUPPLIES) ×4 IMPLANT
PENCIL SMOKE EVACUATOR (MISCELLANEOUS) IMPLANT
PROTECTOR NERVE ULNAR (MISCELLANEOUS) ×2 IMPLANT
SET IRRIG Y TYPE TUR BLADDER L (SET/KITS/TRAYS/PACK) ×2 IMPLANT
SUT NYLON 3 0 (SUTURE) ×4 IMPLANT
SWAB COLLECTION DEVICE MRSA (MISCELLANEOUS) IMPLANT
SWAB CULTURE ESWAB REG 1ML (MISCELLANEOUS) IMPLANT
TOWEL OR 17X26 10 PK STRL BLUE (TOWEL DISPOSABLE) ×2 IMPLANT

## 2024-05-19 NOTE — Transfer of Care (Signed)
 Immediate Anesthesia Transfer of Care Note  Patient: Kyle Ellis  Procedure(s) Performed: BURSECTOMY, ELBOW (Right: Elbow)  Patient Location: PACU  Anesthesia Type:General  Level of Consciousness: sedated and responds to stimulation  Airway & Oxygen Therapy: Patient connected to face mask  Post-op Assessment: Report given to RN and Post -op Vital signs reviewed and stable  Post vital signs: Reviewed and stable  Last Vitals:  Vitals Value Taken Time  BP 142/89 05/19/24 0902  Temp 36.5 C 05/19/24 0902  Pulse 73 05/19/24 0905  Resp 12 05/19/24 0905  SpO2 100 % 05/19/24 0905  Vitals shown include unfiled device data.  Last Pain:  Vitals:   05/19/24 0650  PainSc: 7       Patients Stated Pain Goal: 5 (05/19/24 0650)  Complications: No notable events documented.

## 2024-05-19 NOTE — Op Note (Signed)
 05/19/2024  8:50 AM  PATIENT:  Kyle Ellis    PRE-OPERATIVE DIAGNOSIS:  Abscess of right olecranon bursa  POST-OPERATIVE DIAGNOSIS:  Same  PROCEDURE: Right elbow septic olecranon bursitis  SURGEON:  Neville Barbone, MD  PHYSICIAN ASSISTANT: None  ANESTHESIA:   General  PREOPERATIVE INDICATIONS:  Kyle Ellis is a  43 y.o. male with a diagnosis of Abscess of right olecranon bursa who failed conservative measures and elected for surgical management.    The risks benefits and alternatives were discussed with the patient preoperatively including but not limited to the risks of infection, bleeding, nerve injury, cardiopulmonary complications, the need for revision surgery, among others, and the patient was willing to proceed.  ESTIMATED BLOOD LOSS: 50 mL  OPERATIVE IMPLANTS:   * No implants in log *  OPERATIVE FINDINGS: Septic olecranon bursitis with necrotic appearing bursa, with some crystalline deposits, possible previous cortisone injections?  OPERATIVE PROCEDURE: The patient is brought to the operating room and placed in the supine position.  General anesthesia was administered.  Kyle Ellis was turned into the lateral decubitus position.  All bony prominences were padded.  Axillary roll was placed.  A prescrub was performed, and then the right upper extremity was prepped and draped in usual sterile fashion.  Timeout performed.  Posterior incision over the olecranon bursa was performed and the olecranon bursa was excised sharply with a knife, the purulent material and necrotic bursal tissue was excised with a knife, as well as scissors, and a rongeur.  I then irrigated a total of 3 L of normal saline through the wound, and then placed vancomycin powder, I began antibiotics after the culture was taken, although Kyle Ellis had received some Rocephin in the office, as well as doxycycline by mouth.  I repaired the skin loosely with 2 sutures using interrupted nylon.  I then dressed the wounds  with sterile gauze followed by bulky dressing and a posterior splint.  Kyle Ellis was awakened and returned to the PACU in stable and satisfactory condition.  The ulnar nerve was protected throughout the case, and the fascial covering over the ulnar nerve appeared intact at the completion of the case.  The triceps was also attached although there was some degree of wear at the triceps insertion itself.  Kyle Ellis tolerated the procedure well, we will plan for discharge with oral antibiotics, and a dressing change on Monday in the office.  Osa Blase, MD

## 2024-05-19 NOTE — Anesthesia Postprocedure Evaluation (Signed)
 Anesthesia Post Note  Patient: Kyle Ellis  Procedure(s) Performed: BURSECTOMY, ELBOW (Right: Elbow) INCISION AND DRAINAGE, ELBOW, BURSA (Right: Elbow)     Patient location during evaluation: PACU Anesthesia Type: General Level of consciousness: awake and alert Pain management: pain level controlled Vital Signs Assessment: post-procedure vital signs reviewed and stable Respiratory status: spontaneous breathing, nonlabored ventilation, respiratory function stable and patient connected to nasal cannula oxygen Cardiovascular status: blood pressure returned to baseline and stable Postop Assessment: no apparent nausea or vomiting Anesthetic complications: no  No notable events documented.  Last Vitals:  Vitals:   05/19/24 0945 05/19/24 1000  BP: (!) 145/96 138/87  Pulse: 72 74  Resp: 14 15  Temp:  36.6 C  SpO2: 98% 94%    Last Pain:  Vitals:   05/19/24 1000  TempSrc: Temporal  PainSc: 2                  Rosalita Combe

## 2024-05-19 NOTE — Interval H&P Note (Signed)
 History and Physical Interval Note:  05/19/2024 7:33 AM  Kyle Ellis  has presented today for surgery, with the diagnosis of Abscess of right olecranon bursa.  The various methods of treatment have been discussed with the patient and family. After consideration of risks, benefits and other options for treatment, the patient has consented to  Procedure(s): BURSECTOMY, ELBOW (Right) with I&D as a surgical intervention.  The patient's history has been reviewed, patient examined, no change in status, stable for surgery.  I have reviewed the patient's chart and labs.  Questions were answered to the patient's satisfaction.     Neville Barbone

## 2024-05-19 NOTE — Anesthesia Procedure Notes (Signed)
 Procedure Name: LMA Insertion Date/Time: 05/19/2024 7:59 AM  Performed by: Darlena Ego, CRNAPre-anesthesia Checklist: Patient identified, Emergency Drugs available, Suction available and Patient being monitored Patient Re-evaluated:Patient Re-evaluated prior to induction Oxygen Delivery Method: Circle System Utilized Preoxygenation: Pre-oxygenation with 100% oxygen Induction Type: IV induction Ventilation: Mask ventilation without difficulty LMA: LMA inserted LMA Size: 4.0 Number of attempts: 1 Airway Equipment and Method: Bite block Placement Confirmation: positive ETCO2 Tube secured with: Tape Dental Injury: Teeth and Oropharynx as per pre-operative assessment

## 2024-05-19 NOTE — Discharge Instructions (Addendum)
 Leave splint in place, okay for finger wrist and hand motion.  Plan for dressing change in the office on Monday.  Diet: As you were doing prior to hospitalization   Shower:  May shower but keep the wounds dry, use an occlusive plastic wrap, NO SOAKING IN TUB.  If the bandage gets wet, change with a clean dry gauze.  If you have a splint on, leave the splint in place and keep the splint dry with a plastic bag.  Dressing:  You may change your dressing 3-5 days after surgery, unless you have a splint.  If you have a splint, then just leave the splint in place and we will change your bandages during your first follow-up appointment.    If you had hand or foot surgery, we will plan to remove your stitches in about 2 weeks in the office.  For all other surgeries, there are sticky tapes (steri-strips) on your wounds and all the stitches are absorbable.  Leave the steri-strips in place when changing your dressings, they will peel off with time, usually 2-3 weeks.  Activity:  Increase activity slowly as tolerated, but follow the weight bearing instructions below.  The rules on driving is that you can not be taking narcotics while you drive, and you must feel in control of the vehicle.    Weight Bearing:   no lifting with right arm.    To prevent constipation: you may use a stool softener such as -  Colace (over the counter) 100 mg by mouth twice a day  Drink plenty of fluids (prune juice may be helpful) and high fiber foods Miralax (over the counter) for constipation as needed.    Itching:  If you experience itching with your medications, try taking only a single pain pill, or even half a pain pill at a time.  You may take up to 10 pain pills per day, and you can also use benadryl over the counter for itching or also to help with sleep.   Precautions:  If you experience chest pain or shortness of breath - call 911 immediately for transfer to the hospital emergency department!!  If you develop a  fever greater that 101 F, purulent drainage from wound, increased redness or drainage from wound, or calf pain -- Call the office at [phone]                                                Follow- Up Appointment:  Please call for an appointment to be seen in 2 weeks [place] - [phone]

## 2024-05-20 ENCOUNTER — Encounter (HOSPITAL_COMMUNITY): Payer: Self-pay | Admitting: Orthopedic Surgery

## 2024-05-24 LAB — AEROBIC/ANAEROBIC CULTURE W GRAM STAIN (SURGICAL/DEEP WOUND)

## 2024-09-27 ENCOUNTER — Ambulatory Visit (HOSPITAL_BASED_OUTPATIENT_CLINIC_OR_DEPARTMENT_OTHER): Admission: RE | Admit: 2024-09-27 | Source: Home / Self Care | Admitting: Orthopaedic Surgery

## 2024-09-27 SURGERY — ARTHROSCOPY, SHOULDER, WITH ROTATOR CUFF REPAIR
Anesthesia: General | Laterality: Right

## 2024-10-11 ENCOUNTER — Ambulatory Visit (HOSPITAL_COMMUNITY): Attending: Orthopaedic Surgery | Admitting: Occupational Therapy

## 2024-10-11 DIAGNOSIS — R29898 Other symptoms and signs involving the musculoskeletal system: Secondary | ICD-10-CM | POA: Insufficient documentation

## 2024-10-11 DIAGNOSIS — M25611 Stiffness of right shoulder, not elsewhere classified: Secondary | ICD-10-CM | POA: Insufficient documentation

## 2024-10-11 DIAGNOSIS — M25511 Pain in right shoulder: Secondary | ICD-10-CM | POA: Insufficient documentation

## 2024-10-11 NOTE — Therapy (Signed)
 Pt arrived for Evaluation s/p Massive R RCR, Bicep tendon transfer, SAD. Upon further review and discussion, pt is not able to be seen for OT evaluation until 6 weeks+ post op following pt's follow up with MD. Session ended and new schedule provided following pt's next MD appointment.   Valentin Nightingale, OTR/L [facility] [phone]

## 2024-11-01 ENCOUNTER — Ambulatory Visit (HOSPITAL_COMMUNITY): Admitting: Occupational Therapy

## 2024-11-02 ENCOUNTER — Ambulatory Visit (HOSPITAL_COMMUNITY): Admitting: Occupational Therapy

## 2024-11-06 ENCOUNTER — Ambulatory Visit (HOSPITAL_COMMUNITY): Admitting: Occupational Therapy

## 2024-11-08 ENCOUNTER — Ambulatory Visit (HOSPITAL_COMMUNITY): Admitting: Occupational Therapy

## 2024-11-12 ENCOUNTER — Encounter (HOSPITAL_COMMUNITY): Payer: Self-pay | Admitting: Occupational Therapy

## 2024-11-12 ENCOUNTER — Ambulatory Visit (HOSPITAL_COMMUNITY): Attending: Orthopaedic Surgery | Admitting: Occupational Therapy

## 2024-11-12 DIAGNOSIS — R29898 Other symptoms and signs involving the musculoskeletal system: Secondary | ICD-10-CM | POA: Insufficient documentation

## 2024-11-12 DIAGNOSIS — M25511 Pain in right shoulder: Secondary | ICD-10-CM | POA: Insufficient documentation

## 2024-11-12 DIAGNOSIS — M25611 Stiffness of right shoulder, not elsewhere classified: Secondary | ICD-10-CM | POA: Insufficient documentation

## 2024-11-12 NOTE — Therapy (Signed)
 OUTPATIENT OCCUPATIONAL THERAPY ORTHO EVALUATION  Patient Name: Kyle Ellis MRN: [id number] DOB:[birth year], 43 y.o., male Today's Date: 11/12/2024   END OF SESSION:  OT End of Session - 11/12/24 1505     Visit Number 1    Number of Visits 16    Date for Recertification  01/12/25    Authorization Type [employer]    Authorization Time Period Requesting 16 visits    Authorization - Visit Number 0    Authorization - Number of Visits 16    OT Start Time 1352    OT Stop Time 1438    OT Time Calculation (min) 46 min    Activity Tolerance Patient tolerated treatment well    Behavior During Therapy WFL for tasks assessed/performed          Past Medical History:  Diagnosis Date   Abscess of right olecranon bursa 05/19/2024   History of kidney stones    Past Surgical History:  Procedure Laterality Date   CYSTOSCOPY WITH RETROGRADE PYELOGRAM, URETEROSCOPY AND STENT PLACEMENT Left 07/11/2020   Procedure: CYSTOSCOPY WITH RETROGRADE PYELOGRAM, URETEROSCOPY AND STENT PLACEMENT;  Surgeon: Alvaro Hummer, MD;  Location: [facility];  Service: Urology;  Laterality: Left;  1 HR   ELBOW SURGERY Right    HERNIA REPAIR     20 years ago   HOLMIUM LASER APPLICATION Left 07/11/2020   Procedure: HOLMIUM LASER APPLICATION;  Surgeon: Alvaro Hummer, MD;  Location: [facility];  Service: Urology;  Laterality: Left;   INCISION AND DRAINAGE, BELOW ELBOW, BURSA Right 05/19/2024   Procedure: INCISION AND DRAINAGE, ELBOW, BURSA;  Surgeon: Josefina Chew, MD;  Location: [facility];  Service: Orthopedics;  Laterality: Right;   OLECRANON BURSECTOMY Right 05/19/2024   Procedure: JENELLE SPAIN;  Surgeon: Josefina Chew, MD;  Location: [facility];  Service: Orthopedics;  Laterality: Right;   piece of spine removed      2019 - done at surgical center in [place] by dr elsner    Patient Active Problem List   Diagnosis Date Noted   Abscess of right olecranon bursa 05/19/2024    PCP: [facility] REFERRING  PROVIDER: Cristy Bonner DASEN, MD  ONSET DATE: 09/26/24  REFERRING DIAG: S01.109 (ICD-10-CM) - Other specified postprocedural states   THERAPY DIAG:  Acute pain of right shoulder - Plan: Ot plan of care cert/re-cert  Shoulder stiffness, right - Plan: Ot plan of care cert/re-cert  Other symptoms and signs involving the musculoskeletal system - Plan: Ot plan of care cert/re-cert  Rationale for Evaluation and Treatment: Rehabilitation  SUBJECTIVE:   SUBJECTIVE STATEMENT: It just feels tight Pt accompanied by: self  PERTINENT HISTORY: Pt s/p R massive RCR, Biceps tendon transfer, DCE, SA on 09/26/24  PRECAUTIONS: Shoulder  WEIGHT BEARING RESTRICTIONS: Yes >2lbs  PAIN:  Are you having pain? No -pt reports tightness  FALLS: Has patient fallen in last 6 months? No  PLOF: Independent  PATIENT GOALS: To use his arm normally  NEXT MD VISIT: 12/25/24  OBJECTIVE:   HAND DOMINANCE: Right  ADLs: Overall ADLs: Pt reports being unable to reach his arm forward and put his key in the ignition, Pt unable reach up limiting dressing and bathing. Pt unable to lift and carry any pots and pans, groceries, or yard tools due to pain and protocol   FUNCTIONAL OUTCOME MEASURES: Quick Dash: 47.73  UPPER EXTREMITY ROM:       Assessed in supine, er/IR adducted  Passive ROM Right eval  Shoulder flexion 132  Shoulder abduction  121  Shoulder internal rotation 90  Shoulder external rotation 10  (Blank rows = not tested)    UPPER EXTREMITY MMT:     Assessed in seated, er/IR adducted  MMT Right eval  Shoulder flexion   Shoulder abduction   Shoulder internal rotation   Shoulder external rotation   (Blank rows = not tested)  11/12/24: unable to test due to protocol  SENSATION: WFL  EDEMA: No swelling noted  OBSERVATIONS: Severe fascial restrictions noted along the biceps and deltoid, moderate fascial restrictions along the scapular region and trapezius.    TODAY'S TREATMENT:                                                                                                                               DATE:   11/12/24 -Table Slides: flexion, abduction, er/Ir, x10 -Pendulums: circles, 2x30   PATIENT EDUCATION: Education details: Table Slides and Pendulums Person educated: Patient Education method: Explanation, Demonstration, and Handouts Education comprehension: verbalized understanding, returned demonstration, and needs further education  HOME EXERCISE PROGRAM: Table Slides and Pendulums  GOALS: Goals reviewed with patient? Yes   SHORT TERM GOALS: Target date: 12/12/24  Pt will be provided with and educated on HEP to improve mobility in RUE required for use during ADL completion.   Goal status: INITIAL  2.  Pt will increase RUE P/ROM by 20 degrees to improve ability to use RUE during dressing tasks with minimal compensatory techniques.   Goal status: INITIAL  3.  Pt will increase RUE strength to 3+/5 to improve ability to reach for items at waist to chest height during bathing and grooming tasks.   Goal status: INITIAL  LONG TERM GOALS: Target date: 01/12/25  Pt will decrease pain in RUE to 3/10 or less to improve ability to sleep for 2+ consecutive hours without waking due to pain.   Goal status: INITIAL  2.  Pt will decrease RUE fascial restrictions to min amounts or less to improve mobility required for functional reaching tasks.   Goal status: INITIAL  3.  Pt will increase RUE A/ROM by 25 degrees to improve ability to use RUE when reaching overhead or behind back during dressing and bathing tasks.   Goal status: INITIAL  4.  Pt will increase RUE strength to 5/5 or greater to improve ability to use RUE when lifting or carrying items during meal preparation/housework/yardwork tasks.   Goal status: INITIAL  5.  Pt will return to highest level of function using RUE as dominant during functional task completion.   Goal status:  INITIAL   ASSESSMENT:  CLINICAL IMPRESSION: Patient is a 43 y.o. male who was seen today for occupational therapy evaluation for s/p R RCR, SAD, Bicep tenodesis, and DCR. Pt presents with increased pain and fascial restrictions, decreased ROM, strength, and functional use of the RUE.   PERFORMANCE DEFICITS: in functional skills including in functional skills including ADLs, IADLs, coordination, tone, ROM, strength, pain, fascial restrictions,  muscle spasms, and UE functional use.  IMPAIRMENTS: are limiting patient from ADLs, IADLs, rest and sleep, work, leisure, and social participation.   COMORBIDITIES: has no other co-morbidities that affects occupational performance. Patient will benefit from skilled OT to address above impairments and improve overall function.  MODIFICATION OR ASSISTANCE TO COMPLETE EVALUATION: Min-Moderate modification of tasks or assist with assess necessary to complete an evaluation.  OT OCCUPATIONAL PROFILE AND HISTORY: Detailed assessment: Review of records and additional review of physical, cognitive, psychosocial history related to current functional performance.  CLINICAL DECISION MAKING: Moderate - several treatment options, min-mod task modification necessary  REHAB POTENTIAL: Good  EVALUATION COMPLEXITY: Low      PLAN:  OT FREQUENCY: 2x/week  OT DURATION: 8 weeks  PLANNED INTERVENTIONS: 97168 OT Re-evaluation, 97535 self care/ADL training, 02889 therapeutic exercise, 97530 therapeutic activity, 97112 neuromuscular re-education, 97140 manual therapy, 97035 ultrasound, 97010 moist heat, 97032 electrical stimulation (manual), passive range of motion, functional mobility training, energy conservation, coping strategies training, patient/family education, and DME and/or AE instructions  RECOMMENDED OTHER SERVICES: N/A  CONSULTED AND AGREED WITH PLAN OF CARE: Patient  PLAN FOR NEXT SESSION: Manual Therapy, P/ROM, Mercer Orts, Wall washes  Valentin Nightingale, OTR/L [facility] [phone] Valentin Jillyn Nightingale, OT 2024/12/10, 3:06 PM   Managed Medicaid Authorization Request Treatment Start Date: 10-Dec-2024  Visit Dx Codes: M25.511, M25.611, R29.898  Functional Tool Score: 47.73  For all possible CPT codes, reference the Planned Interventions line above.     Check all conditions that are expected to impact treatment: {Conditions expected to impact treatment:None of these apply   If treatment provided at initial evaluation, no treatment charged due to lack of authorization.

## 2024-11-12 NOTE — Patient Instructions (Signed)

## 2024-11-14 ENCOUNTER — Ambulatory Visit (HOSPITAL_COMMUNITY): Admitting: Occupational Therapy

## 2024-11-14 ENCOUNTER — Encounter (HOSPITAL_COMMUNITY): Payer: Self-pay | Admitting: Occupational Therapy

## 2024-11-14 DIAGNOSIS — M25511 Pain in right shoulder: Secondary | ICD-10-CM | POA: Diagnosis not present

## 2024-11-14 DIAGNOSIS — R29898 Other symptoms and signs involving the musculoskeletal system: Secondary | ICD-10-CM

## 2024-11-14 DIAGNOSIS — M25611 Stiffness of right shoulder, not elsewhere classified: Secondary | ICD-10-CM

## 2024-11-14 NOTE — Therapy (Signed)
 OUTPATIENT OCCUPATIONAL THERAPY ORTHO EVALUATION  Patient Name: Kyle Ellis MRN: [id number] DOB:1981/01/16, 43 y.o., male Today's Date: 11/14/2024   END OF SESSION:  OT End of Session - 11/14/24 1404     Visit Number 2    Number of Visits 16    Date for Recertification  01/12/25    Authorization Type [employer]    Authorization Time Period Requesting 16 visits    Authorization - Visit Number 1    Authorization - Number of Visits 16    OT Start Time 1302    OT Stop Time 1341    OT Time Calculation (min) 39 min    Activity Tolerance Patient tolerated treatment well    Behavior During Therapy WFL for tasks assessed/performed          Past Medical History:  Diagnosis Date   Abscess of right olecranon bursa 05/19/2024   History of kidney stones    Past Surgical History:  Procedure Laterality Date   CYSTOSCOPY WITH RETROGRADE PYELOGRAM, URETEROSCOPY AND STENT PLACEMENT Left 07/11/2020   Procedure: CYSTOSCOPY WITH RETROGRADE PYELOGRAM, URETEROSCOPY AND STENT PLACEMENT;  Surgeon: Alvaro Hummer, MD;  Location: [facility];  Service: Urology;  Laterality: Left;  1 HR   ELBOW SURGERY Right    HERNIA REPAIR     20 years ago   HOLMIUM LASER APPLICATION Left 07/11/2020   Procedure: HOLMIUM LASER APPLICATION;  Surgeon: Alvaro Hummer, MD;  Location: [facility];  Service: Urology;  Laterality: Left;   INCISION AND DRAINAGE, BELOW ELBOW, BURSA Right 05/19/2024   Procedure: INCISION AND DRAINAGE, ELBOW, BURSA;  Surgeon: Josefina Chew, MD;  Location: [facility];  Service: Orthopedics;  Laterality: Right;   OLECRANON BURSECTOMY Right 05/19/2024   Procedure: JENELLE SPAIN;  Surgeon: Josefina Chew, MD;  Location: [facility];  Service: Orthopedics;  Laterality: Right;   piece of spine removed      2019 - done at surgical center in [place] by dr elsner    Patient Active Problem List   Diagnosis Date Noted   Abscess of right olecranon bursa 05/19/2024    PCP: [facility] REFERRING  PROVIDER: Cristy Bonner DASEN, MD  ONSET DATE: 09/26/24  REFERRING DIAG: S01.109 (ICD-10-CM) - Other specified postprocedural states   THERAPY DIAG:  Acute pain of right shoulder  Shoulder stiffness, right  Other symptoms and signs involving the musculoskeletal system  Rationale for Evaluation and Treatment: Rehabilitation  SUBJECTIVE:   SUBJECTIVE STATEMENT: I think that knot is better today Pt accompanied by: self  PERTINENT HISTORY: Pt s/p R massive RCR, Biceps tendon transfer, DCE, SA on 09/26/24  PRECAUTIONS: Shoulder  WEIGHT BEARING RESTRICTIONS: Yes >2lbs  PAIN:  Are you having pain? Yes: NPRS scale: 2/10 Pain location: anterior shoulder girdle/biceps Pain description: aching Aggravating factors: movement Relieving factors: rest   FALLS: Has patient fallen in last 6 months? No  PLOF: Independent  PATIENT GOALS: To use his arm normally  NEXT MD VISIT: 12/25/24  OBJECTIVE:   HAND DOMINANCE: Right  ADLs: Overall ADLs: Pt reports being unable to reach his arm forward and put his key in the ignition, Pt unable reach up limiting dressing and bathing. Pt unable to lift and carry any pots and pans, groceries, or yard tools due to pain and protocol   FUNCTIONAL OUTCOME MEASURES: Quick Dash: 47.73  UPPER EXTREMITY ROM:       Assessed in supine, er/IR adducted  Passive ROM Right eval  Shoulder flexion 132  Shoulder abduction 121  Shoulder  internal rotation 90  Shoulder external rotation 10  (Blank rows = not tested)    UPPER EXTREMITY MMT:     Assessed in seated, er/IR adducted  MMT Right eval  Shoulder flexion   Shoulder abduction   Shoulder internal rotation   Shoulder external rotation   (Blank rows = not tested)  11/12/24: unable to test due to protocol  SENSATION: WFL  EDEMA: No swelling noted  OBSERVATIONS: Severe fascial restrictions noted along the biceps and deltoid, moderate fascial restrictions along the scapular region and  trapezius.    TODAY'S TREATMENT:                                                                                                                              DATE:   11/14/24 -Manual Therapy: myofascial release and trigger point applied to biceps, deltoid, and scapular region in order to reduce pain and restrictions, as well as improve ROM -P/ROM: supine - flexion, abduction, er/IR, horizontal abduction, x10 -Low Wall Washes: 2x60 -Thumb tacs: 2x45 -Ball Rolls: flexion, abduction, x15  11/12/24 -Table Slides: flexion, abduction, er/Ir, x10 -Pendulums: circles, 2x30   PATIENT EDUCATION: Education details: Table Slides and Pendulums Person educated: Patient Education method: Explanation, Demonstration, and Handouts Education comprehension: verbalized understanding, returned demonstration, and needs further education  HOME EXERCISE PROGRAM: Table Slides and Pendulums  GOALS: Goals reviewed with patient? Yes   SHORT TERM GOALS: Target date: 12/12/24  Pt will be provided with and educated on HEP to improve mobility in RUE required for use during ADL completion.   Goal status: IN PROGRESS  2.  Pt will increase RUE P/ROM by 20 degrees to improve ability to use RUE during dressing tasks with minimal compensatory techniques.   Goal status: IN PROGRESS  3.  Pt will increase RUE strength to 3+/5 to improve ability to reach for items at waist to chest height during bathing and grooming tasks.   Goal status: IN PROGRESS  LONG TERM GOALS: Target date: 01/12/25  Pt will decrease pain in RUE to 3/10 or less to improve ability to sleep for 2+ consecutive hours without waking due to pain.   Goal status: IN PROGRESS  2.  Pt will decrease RUE fascial restrictions to min amounts or less to improve mobility required for functional reaching tasks.   Goal status: IN PROGRESS  3.  Pt will increase RUE A/ROM by 25 degrees to improve ability to use RUE when reaching overhead or behind  back during dressing and bathing tasks.   Goal status: IN PROGRESS  4.  Pt will increase RUE strength to 5/5 or greater to improve ability to use RUE when lifting or carrying items during meal preparation/housework/yardwork tasks.   Goal status: IN PROGRESS  5.  Pt will return to highest level of function using RUE as dominant during functional task completion.   Goal status: IN PROGRESS   ASSESSMENT:  CLINICAL IMPRESSION: Pt presented to first OT treatment session  with minimal pain and improved fascial restrictions. He tolerated P/ROM fair, requiring short breaks after 5 of each movement due to increased pain. Pt also having pain with wall washing and ball rolls, however reports that with repetition the pain decreases. OT providing verbal and visual cuing for positioning and technique throughout session.   PERFORMANCE DEFICITS: in functional skills including in functional skills including ADLs, IADLs, coordination, tone, ROM, strength, pain, fascial restrictions, muscle spasms, and UE functional use.  PLAN:  OT FREQUENCY: 2x/week  OT DURATION: 8 weeks  PLANNED INTERVENTIONS: 97168 OT Re-evaluation, 97535 self care/ADL training, 02889 therapeutic exercise, 97530 therapeutic activity, 97112 neuromuscular re-education, 97140 manual therapy, 97035 ultrasound, 97010 moist heat, 97032 electrical stimulation (manual), passive range of motion, functional mobility training, energy conservation, coping strategies training, patient/family education, and DME and/or AE instructions  RECOMMENDED OTHER SERVICES: N/A  CONSULTED AND AGREED WITH PLAN OF CARE: Patient  PLAN FOR NEXT SESSION: Manual Therapy, P/ROM, Mercer Orts, Wall washes  Valentin Nightingale, OTR/L [facility] [phone] Valentin Jillyn Nightingale, OT 11/14/2024, 2:05 PM   Managed Medicaid Authorization Request Treatment Start Date: 11-23-2024  Visit Dx Codes: M25.511, M25.611, R29.898  Functional Tool Score:  47.73  For all possible CPT codes, reference the Planned Interventions line above.     Check all conditions that are expected to impact treatment: {Conditions expected to impact treatment:None of these apply   If treatment provided at initial evaluation, no treatment charged due to lack of authorization.

## 2024-11-19 ENCOUNTER — Ambulatory Visit (HOSPITAL_COMMUNITY): Admitting: Occupational Therapy

## 2024-11-19 ENCOUNTER — Encounter (HOSPITAL_COMMUNITY): Payer: Self-pay | Admitting: Occupational Therapy

## 2024-11-19 DIAGNOSIS — M25611 Stiffness of right shoulder, not elsewhere classified: Secondary | ICD-10-CM | POA: Insufficient documentation

## 2024-11-19 DIAGNOSIS — M25511 Pain in right shoulder: Secondary | ICD-10-CM | POA: Diagnosis present

## 2024-11-19 DIAGNOSIS — R29898 Other symptoms and signs involving the musculoskeletal system: Secondary | ICD-10-CM | POA: Insufficient documentation

## 2024-11-19 NOTE — Therapy (Signed)
 OUTPATIENT OCCUPATIONAL THERAPY ORTHO TREATMENT NOTE  Patient Name: Kyle Ellis MRN: [id number] DOB:[birth year], 43 y.o., male Today's Date: 11/19/2024   END OF SESSION:  OT End of Session - 11/19/24 1044     Visit Number 3    Number of Visits 16    Date for Recertification  01/12/25    Authorization Type [facility] Medicaid    Authorization Time Period 16 visits (11/24-1/19)    Authorization - Visit Number 2    Authorization - Number of Visits 16    OT Start Time 0901    OT Stop Time 0947    OT Time Calculation (min) 46 min    Activity Tolerance Patient tolerated treatment well    Behavior During Therapy [facility] for tasks assessed/performed           Past Medical History:  Diagnosis Date   Abscess of right olecranon bursa 05/19/2024   History of kidney stones    Past Surgical History:  Procedure Laterality Date   CYSTOSCOPY WITH RETROGRADE PYELOGRAM, URETEROSCOPY AND STENT PLACEMENT Left 07/11/2020   Procedure: CYSTOSCOPY WITH RETROGRADE PYELOGRAM, URETEROSCOPY AND STENT PLACEMENT;  Surgeon: Alvaro Hummer, MD;  Location: [facility];  Service: Urology;  Laterality: Left;  1 HR   ELBOW SURGERY Right    HERNIA REPAIR     20 years ago   HOLMIUM LASER APPLICATION Left 07/11/2020   Procedure: HOLMIUM LASER APPLICATION;  Surgeon: Alvaro Hummer, MD;  Location: [facility];  Service: Urology;  Laterality: Left;   INCISION AND DRAINAGE, BELOW ELBOW, BURSA Right 05/19/2024   Procedure: INCISION AND DRAINAGE, ELBOW, BURSA;  Surgeon: Josefina Chew, MD;  Location: [facility];  Service: Orthopedics;  Laterality: Right;   OLECRANON BURSECTOMY Right 05/19/2024   Procedure: JENELLE SPAIN;  Surgeon: Josefina Chew, MD;  Location: [facility];  Service: Orthopedics;  Laterality: Right;   piece of spine removed      2019 - done at surgical center in [place] by dr elsner    Patient Active Problem List   Diagnosis Date Noted   Abscess of right olecranon bursa 05/19/2024    PCP: [facility] REFERRING PROVIDER: Cristy Bonner DASEN, MD  ONSET DATE: 09/26/24  REFERRING DIAG: S01.109 (ICD-10-CM) - Other specified postprocedural states   THERAPY DIAG:  Acute pain of right shoulder  Shoulder stiffness, right  Other symptoms and signs involving the musculoskeletal system  Rationale for Evaluation and Treatment: Rehabilitation  SUBJECTIVE:   SUBJECTIVE STATEMENT: It's definitely sore Pt accompanied by: self  PERTINENT HISTORY: Pt s/p R massive RCR, Biceps tendon transfer, DCE, SA on 09/26/24  PRECAUTIONS: Shoulder  WEIGHT BEARING RESTRICTIONS: Yes >2lbs  PAIN:  Are you having pain? Yes: NPRS scale: 2/10 Pain location: anterior shoulder girdle/biceps Pain description: Sore Aggravating factors: movement Relieving factors: rest   FALLS: Has patient fallen in last 6 months? No  PLOF: Independent  PATIENT GOALS: To use his arm normally  NEXT MD VISIT: 12/25/24  OBJECTIVE:   HAND DOMINANCE: Right  ADLs: Overall ADLs: Pt reports being unable to reach his arm forward and put his key in the ignition, Pt unable reach up limiting dressing and bathing. Pt unable to lift and carry any pots and pans, groceries, or yard tools due to pain and protocol   FUNCTIONAL OUTCOME MEASURES: Quick Dash: 47.73  UPPER EXTREMITY ROM:       Assessed in supine, er/IR adducted  Passive ROM Right eval  Shoulder flexion 132  Shoulder abduction 121  Shoulder internal rotation  90  Shoulder external rotation 10  (Blank rows = not tested)    UPPER EXTREMITY MMT:     Assessed in seated, er/IR adducted  MMT Right eval  Shoulder flexion   Shoulder abduction   Shoulder internal rotation   Shoulder external rotation   (Blank rows = not tested)  11/12/24: unable to test due to protocol  SENSATION: WFL  EDEMA: No swelling noted  OBSERVATIONS: Severe fascial restrictions noted along the biceps and deltoid, moderate fascial restrictions along the scapular region and  trapezius.    TODAY'S TREATMENT:                                                                                                                              DATE:   11/19/24 -Manual Therapy: myofascial release and trigger point applied to biceps, deltoid, and scapular region in order to reduce pain and restrictions, as well as improve ROM -P/ROM: supine - flexion, abduction, er/IR, horizontal abduction, x10 -AA/ROM: supine - flexion, abduction, protraction, horizontal abduction, er/IR, x10 -Low Wall Washes: 2x60 -Ball Rolls: flexion, abduction, x15  11/14/24 -Manual Therapy: myofascial release and trigger point applied to biceps, deltoid, and scapular region in order to reduce pain and restrictions, as well as improve ROM -P/ROM: supine - flexion, abduction, er/IR, horizontal abduction, x10 -Low Wall Washes: 2x60 -Thumb tacs: 2x45 -Ball Rolls: flexion, abduction, x15  11/12/24 -Table Slides: flexion, abduction, er/Ir, x10 -Pendulums: circles, 2x30   PATIENT EDUCATION: Education details: Table Slides and Pendulums Person educated: Patient Education method: Explanation, Demonstration, and Handouts Education comprehension: verbalized understanding, returned demonstration, and needs further education  HOME EXERCISE PROGRAM: Table Slides and Pendulums  GOALS: Goals reviewed with patient? Yes   SHORT TERM GOALS: Target date: 12/12/24  Pt will be provided with and educated on HEP to improve mobility in RUE required for use during ADL completion.   Goal status: IN PROGRESS  2.  Pt will increase RUE P/ROM by 20 degrees to improve ability to use RUE during dressing tasks with minimal compensatory techniques.   Goal status: IN PROGRESS  3.  Pt will increase RUE strength to 3+/5 to improve ability to reach for items at waist to chest height during bathing and grooming tasks.   Goal status: IN PROGRESS  LONG TERM GOALS: Target date: 01/12/25  Pt will decrease pain in RUE to  3/10 or less to improve ability to sleep for 2+ consecutive hours without waking due to pain.   Goal status: IN PROGRESS  2.  Pt will decrease RUE fascial restrictions to min amounts or less to improve mobility required for functional reaching tasks.   Goal status: IN PROGRESS  3.  Pt will increase RUE A/ROM by 25 degrees to improve ability to use RUE when reaching overhead or behind back during dressing and bathing tasks.   Goal status: IN PROGRESS  4.  Pt will increase RUE strength to 5/5 or greater to improve ability to use  RUE when lifting or carrying items during meal preparation/housework/yardwork tasks.   Goal status: IN PROGRESS  5.  Pt will return to highest level of function using RUE as dominant during functional task completion.   Goal status: IN PROGRESS   ASSESSMENT:  CLINICAL IMPRESSION: This session pt continues to have minimal pain at rest, only typical soreness from exercises. He is achieving full ROM passively and 75-80% of full ROM assisted with dowel rod. Pt reports completing his HEP several times a day with good mobility. OT Providing verbal and tactile cuing for positioning and technique throughout session.   PERFORMANCE DEFICITS: in functional skills including in functional skills including ADLs, IADLs, coordination, tone, ROM, strength, pain, fascial restrictions, muscle spasms, and UE functional use.  PLAN:  OT FREQUENCY: 2x/week  OT DURATION: 8 weeks  PLANNED INTERVENTIONS: 97168 OT Re-evaluation, 97535 self care/ADL training, 02889 therapeutic exercise, 97530 therapeutic activity, 97112 neuromuscular re-education, 97140 manual therapy, 97035 ultrasound, 97010 moist heat, 97032 electrical stimulation (manual), passive range of motion, functional mobility training, energy conservation, coping strategies training, patient/family education, and DME and/or AE instructions  RECOMMENDED OTHER SERVICES: N/A  CONSULTED AND AGREED WITH PLAN OF CARE:  Patient  PLAN FOR NEXT SESSION: Manual Therapy, P/ROM, Mercer Orts, Wall washes  Valentin Nightingale, OTR/L [facility] [phone] Valentin Jillyn Nightingale, OT 11/19/2024, 10:46 AM   Managed Medicaid Authorization Request Treatment Start Date: 24-Nov-2024  Visit Dx Codes: M25.511, M25.611, R29.898  Functional Tool Score: 47.73  For all possible CPT codes, reference the Planned Interventions line above.     Check all conditions that are expected to impact treatment: {Conditions expected to impact treatment:None of these apply   If treatment provided at initial evaluation, no treatment charged due to lack of authorization.

## 2024-11-22 ENCOUNTER — Ambulatory Visit (HOSPITAL_COMMUNITY): Admitting: Occupational Therapy

## 2024-11-22 ENCOUNTER — Encounter (HOSPITAL_COMMUNITY): Payer: Self-pay | Admitting: Occupational Therapy

## 2024-11-22 DIAGNOSIS — R29898 Other symptoms and signs involving the musculoskeletal system: Secondary | ICD-10-CM

## 2024-11-22 DIAGNOSIS — M25511 Pain in right shoulder: Secondary | ICD-10-CM | POA: Diagnosis not present

## 2024-11-22 DIAGNOSIS — M25611 Stiffness of right shoulder, not elsewhere classified: Secondary | ICD-10-CM

## 2024-11-22 NOTE — Patient Instructions (Signed)

## 2024-11-22 NOTE — Therapy (Signed)
 OUTPATIENT OCCUPATIONAL THERAPY ORTHO TREATMENT NOTE  Patient Name: Kyle Ellis MRN: [id number] DOB:1981/01/19, 43 y.o., male Today's Date: 11/22/2024   END OF SESSION:  OT End of Session - 11/22/24 0939     Visit Number 4    Number of Visits 16    Date for Recertification  01/12/25   progress note 12/12/24   Authorization Type [employer] Medicaid    Authorization Time Period 16 visits (11/24-1/19)    Authorization - Visit Number 3    Authorization - Number of Visits 16    OT Start Time 0858    OT Stop Time [phone]    OT Time Calculation (min) 44 min    Activity Tolerance Patient tolerated treatment well    Behavior During Therapy [facility] for tasks assessed/performed            Past Medical History:  Diagnosis Date   Abscess of right olecranon bursa 05/19/2024   History of kidney stones    Past Surgical History:  Procedure Laterality Date   CYSTOSCOPY WITH RETROGRADE PYELOGRAM, URETEROSCOPY AND STENT PLACEMENT Left 07/11/2020   Procedure: CYSTOSCOPY WITH RETROGRADE PYELOGRAM, URETEROSCOPY AND STENT PLACEMENT;  Surgeon: Alvaro Hummer, MD;  Location: [facility];  Service: Urology;  Laterality: Left;  1 HR   ELBOW SURGERY Right    HERNIA REPAIR     20 years ago   HOLMIUM LASER APPLICATION Left 07/11/2020   Procedure: HOLMIUM LASER APPLICATION;  Surgeon: Alvaro Hummer, MD;  Location: [facility];  Service: Urology;  Laterality: Left;   INCISION AND DRAINAGE, BELOW ELBOW, BURSA Right 05/19/2024   Procedure: INCISION AND DRAINAGE, ELBOW, BURSA;  Surgeon: Josefina Chew, MD;  Location: [facility];  Service: Orthopedics;  Laterality: Right;   OLECRANON BURSECTOMY Right 05/19/2024   Procedure: JENELLE SPAIN;  Surgeon: Josefina Chew, MD;  Location: [facility];  Service: Orthopedics;  Laterality: Right;   piece of spine removed      2019 - done at surgical center in [place] by dr elsner    Patient Active Problem List   Diagnosis Date Noted   Abscess of right olecranon bursa 05/19/2024    [facility]: [facility] REFERRING PROVIDER: Cristy Bonner DASEN, MD  ONSET DATE: 09/26/24  REFERRING DIAG: S01.109 (ICD-10-CM) - Other specified postprocedural states   THERAPY DIAG:  Acute pain of right shoulder  Shoulder stiffness, right  Other symptoms and signs involving the musculoskeletal system  Rationale for Evaluation and Treatment: Rehabilitation  SUBJECTIVE:   SUBJECTIVE STATEMENT: S: Just the normal stiffness/soreness.  PERTINENT HISTORY: Pt s/p R massive RCR, Biceps tendon transfer, DCE, SA on 09/26/24  PRECAUTIONS: Shoulder  WEIGHT BEARING RESTRICTIONS: Yes >2lbs  PAIN:  Are you having pain? Yes: NPRS scale: 2/10 Pain location: anterior shoulder girdle/biceps Pain description: Sore Aggravating factors: movement Relieving factors: rest   FALLS: Has patient fallen in last 6 months? No  PLOF: Independent  PATIENT GOALS: To use his arm normally  NEXT MD VISIT: 12/25/24  OBJECTIVE:   HAND DOMINANCE: Right  ADLs: Overall ADLs: Pt reports being unable to reach his arm forward and put his key in the ignition, Pt unable reach up limiting dressing and bathing. Pt unable to lift and carry any pots and pans, groceries, or yard tools due to pain and protocol   FUNCTIONAL OUTCOME MEASURES: Quick Dash: 47.73  UPPER EXTREMITY ROM:       Assessed in supine, er/IR adducted  Passive ROM Right eval  Shoulder flexion 132  Shoulder abduction 121  Shoulder internal rotation 90  Shoulder external rotation 10  (Blank rows = not tested)    UPPER EXTREMITY MMT:     Assessed in seated, er/IR adducted  MMT Right eval  Shoulder flexion   Shoulder abduction   Shoulder internal rotation   Shoulder external rotation   (Blank rows = not tested)  11/12/24: unable to test due to protocol  SENSATION: WFL  EDEMA: No swelling noted  OBSERVATIONS: Severe fascial restrictions noted along the biceps and deltoid, moderate fascial restrictions along the scapular region  and trapezius.    TODAY'S TREATMENT:                                                                                                                              DATE:  11/22/24 -Manual Therapy: myofascial release and trigger point applied to biceps, deltoid, and scapular region in order to reduce pain and restrictions, as well as improve ROM -P/ROM: supine - flexion, abduction, er/IR, horizontal abduction, 10 reps -AA/ROM: supine - flexion, abduction, protraction, horizontal abduction, er/IR, 12-15 reps -AA/ROM: standing- flexion, abduction, protraction, horizontal abduction, er/IR, 10 reps  11/19/24 -Manual Therapy: myofascial release and trigger point applied to biceps, deltoid, and scapular region in order to reduce pain and restrictions, as well as improve ROM -P/ROM: supine - flexion, abduction, er/IR, horizontal abduction, x10 -AA/ROM: supine - flexion, abduction, protraction, horizontal abduction, er/IR, x10 -Low Wall Washes: 2x60 -Ball Rolls: flexion, abduction, x15  11/14/24 -Manual Therapy: myofascial release and trigger point applied to biceps, deltoid, and scapular region in order to reduce pain and restrictions, as well as improve ROM -P/ROM: supine - flexion, abduction, er/IR, horizontal abduction, x10 -Low Wall Washes: 2x60 -Thumb tacs: 2x45 -Ball Rolls: flexion, abduction, x15    PATIENT EDUCATION: Education details: AA/ROM Person educated: Patient Education method: Programmer, Multimedia, Facilities Manager, and Handouts Education comprehension: verbalized understanding, returned demonstration, and needs further education  HOME EXERCISE PROGRAM: Table Slides and Pendulums 12/4: AA/ROM  GOALS: Goals reviewed with patient? Yes   SHORT TERM GOALS: Target date: 12/12/24  Pt will be provided with and educated on HEP to improve mobility in RUE required for use during ADL completion.   Goal status: IN PROGRESS  2.  Pt will increase RUE P/ROM by 20 degrees to improve ability  to use RUE during dressing tasks with minimal compensatory techniques.   Goal status: IN PROGRESS  3.  Pt will increase RUE strength to 3+/5 to improve ability to reach for items at waist to chest height during bathing and grooming tasks.   Goal status: IN PROGRESS  LONG TERM GOALS: Target date: 01/12/25  Pt will decrease pain in RUE to 3/10 or less to improve ability to sleep for 2+ consecutive hours without waking due to pain.   Goal status: IN PROGRESS  2.  Pt will decrease RUE fascial restrictions to min amounts or less to improve mobility required for functional reaching tasks.   Goal status: IN PROGRESS  3.  Pt will increase RUE A/ROM by 25 degrees to improve ability to use RUE when reaching overhead or behind back during dressing and bathing tasks.   Goal status: IN PROGRESS  4.  Pt will increase RUE strength to 5/5 or greater to improve ability to use RUE when lifting or carrying items during meal preparation/housework/yardwork tasks.   Goal status: IN PROGRESS  5.  Pt will return to highest level of function using RUE as dominant during functional task completion.   Goal status: IN PROGRESS   ASSESSMENT:  CLINICAL IMPRESSION: Pt reports HEP is going well, reports pain is minimal, no sharp/stabbing pain since a few weeks after the surgery. Continued with manual techniques and passive stretching, pt tolerating ROM to approximately 65-70% range, with exception of er which was at ~25% range. Continued with AA/ROM and progressed to standing, pt achieving 50-75% range with no sharp pains. Pt with the most discomfort during horizontal adduction, 1 rest break while in standing. Verbal cuing for form and technique, updated HEP for AA/ROM.     PERFORMANCE DEFICITS: in functional skills including in functional skills including ADLs, IADLs, coordination, tone, ROM, strength, pain, fascial restrictions, muscle spasms, and UE functional use.  PLAN:  OT FREQUENCY: 2x/week  OT  DURATION: 8 weeks  PLANNED INTERVENTIONS: 97168 OT Re-evaluation, 97535 self care/ADL training, 02889 therapeutic exercise, 97530 therapeutic activity, 97112 neuromuscular re-education, 97140 manual therapy, 97035 ultrasound, 97010 moist heat, 97032 electrical stimulation (manual), passive range of motion, functional mobility training, energy conservation, coping strategies training, patient/family education, and DME and/or AE instructions  CONSULTED AND AGREED WITH PLAN OF CARE: Patient  PLAN FOR NEXT SESSION: Manual Therapy, P/ROM, Mercer Orts, Wall washes; AA/ROM progressing to A/ROM as able    [employer], OTR/L  [phone] 11/22/2024, 9:48 AM

## 2024-11-26 ENCOUNTER — Ambulatory Visit (HOSPITAL_COMMUNITY): Admitting: Occupational Therapy

## 2024-11-27 ENCOUNTER — Ambulatory Visit (HOSPITAL_COMMUNITY): Admitting: Occupational Therapy

## 2024-11-27 ENCOUNTER — Encounter (HOSPITAL_COMMUNITY): Payer: Self-pay | Admitting: Occupational Therapy

## 2024-11-27 DIAGNOSIS — R29898 Other symptoms and signs involving the musculoskeletal system: Secondary | ICD-10-CM

## 2024-11-27 DIAGNOSIS — M25511 Pain in right shoulder: Secondary | ICD-10-CM | POA: Diagnosis not present

## 2024-11-27 DIAGNOSIS — M25611 Stiffness of right shoulder, not elsewhere classified: Secondary | ICD-10-CM

## 2024-11-27 NOTE — Therapy (Signed)
 OUTPATIENT OCCUPATIONAL THERAPY ORTHO TREATMENT NOTE  Patient Name: Kyle Ellis MRN: [id number] DOB:Apr 07, 1981, 43 y.o., male Today's Date: 11/27/2024   END OF SESSION:  OT End of Session - 11/27/24 1042     Visit Number 5    Number of Visits 16    Date for Recertification  01/12/25   progress note 12/12/24   Authorization Type [employer]    Authorization Time Period 16 visits (11/24-1/19)    Authorization - Visit Number 4    Authorization - Number of Visits 16    OT Start Time [phone]    OT Stop Time 1032    OT Time Calculation (min) 44 min    Activity Tolerance Patient tolerated treatment well    Behavior During Therapy WFL for tasks assessed/performed             Past Medical History:  Diagnosis Date   Abscess of right olecranon bursa 05/19/2024   History of kidney stones    Past Surgical History:  Procedure Laterality Date   CYSTOSCOPY WITH RETROGRADE PYELOGRAM, URETEROSCOPY AND STENT PLACEMENT Left 07/11/2020   Procedure: CYSTOSCOPY WITH RETROGRADE PYELOGRAM, URETEROSCOPY AND STENT PLACEMENT;  Surgeon: Alvaro Hummer, MD;  Location: [facility];  Service: Urology;  Laterality: Left;  1 HR   ELBOW SURGERY Right    HERNIA REPAIR     20 years ago   HOLMIUM LASER APPLICATION Left 07/11/2020   Procedure: HOLMIUM LASER APPLICATION;  Surgeon: Alvaro Hummer, MD;  Location: [facility];  Service: Urology;  Laterality: Left;   INCISION AND DRAINAGE, BELOW ELBOW, BURSA Right 05/19/2024   Procedure: INCISION AND DRAINAGE, ELBOW, BURSA;  Surgeon: Josefina Chew, MD;  Location: [facility];  Service: Orthopedics;  Laterality: Right;   OLECRANON BURSECTOMY Right 05/19/2024   Procedure: JENELLE SPAIN;  Surgeon: Josefina Chew, MD;  Location: [facility];  Service: Orthopedics;  Laterality: Right;   piece of spine removed      2019 - done at surgical center in [place] by dr elsner    Patient Active Problem List   Diagnosis Date Noted   Abscess of right olecranon bursa 05/19/2024    PCP: [name] REFERRING PROVIDER: Cristy Bonner DASEN, MD  ONSET DATE: 09/26/24  REFERRING DIAG: S01.109 (ICD-10-CM) - Other specified postprocedural states   THERAPY DIAG:  Acute pain of right shoulder  Shoulder stiffness, right  Other symptoms and signs involving the musculoskeletal system  Rationale for Evaluation and Treatment: Rehabilitation  SUBJECTIVE:   SUBJECTIVE STATEMENT: S: It's really really tight  PERTINENT HISTORY: Pt s/p R massive RCR, Biceps tendon transfer, DCE, SA on 09/26/24  PRECAUTIONS: Shoulder  WEIGHT BEARING RESTRICTIONS: Yes >2lbs  PAIN:  Are you having pain? Yes: NPRS scale: 4/10 Pain location: anterior shoulder girdle/biceps Pain description: Sore Aggravating factors: movement Relieving factors: rest   FALLS: Has patient fallen in last 6 months? No  PLOF: Independent  PATIENT GOALS: To use his arm normally  NEXT MD VISIT: 12/25/24  OBJECTIVE:   HAND DOMINANCE: Right  ADLs: Overall ADLs: Pt reports being unable to reach his arm forward and put his key in the ignition, Pt unable reach up limiting dressing and bathing. Pt unable to lift and carry any pots and pans, groceries, or yard tools due to pain and protocol   FUNCTIONAL OUTCOME MEASURES: Quick Dash: 47.73  UPPER EXTREMITY ROM:       Assessed in supine, er/IR adducted  Passive ROM Right eval  Shoulder flexion 132  Shoulder abduction 121  Shoulder internal rotation 90  Shoulder external rotation 10  (Blank rows = not tested)    UPPER EXTREMITY MMT:     Assessed in seated, er/IR adducted  MMT Right eval  Shoulder flexion   Shoulder abduction   Shoulder internal rotation   Shoulder external rotation   (Blank rows = not tested)  11/12/24: unable to test due to protocol  SENSATION: WFL  EDEMA: No swelling noted  OBSERVATIONS: Severe fascial restrictions noted along the biceps and deltoid, moderate fascial restrictions along the scapular region and  trapezius.    TODAY'S TREATMENT:                                                                                                                              DATE:  11/27/24 -Manual Therapy: myofascial release and trigger point applied to biceps, deltoid, and scapular region in order to reduce pain and restrictions, as well as improve ROM -P/ROM: supine - flexion, abduction, er/IR, horizontal abduction, 10 reps -AA/ROM: seated - flexion, abduction, protraction, horizontal abduction, er/IR, 10 reps -A/ROM: seated - protraction, flexion to 90*, er/IR, 10 reps  11/22/24 -Manual Therapy: myofascial release and trigger point applied to biceps, deltoid, and scapular region in order to reduce pain and restrictions, as well as improve ROM -P/ROM: supine - flexion, abduction, er/IR, horizontal abduction, 10 reps -AA/ROM: supine - flexion, abduction, protraction, horizontal abduction, er/IR, 12-15 reps -AA/ROM: standing- flexion, abduction, protraction, horizontal abduction, er/IR, 10 reps  11/19/24 -Manual Therapy: myofascial release and trigger point applied to biceps, deltoid, and scapular region in order to reduce pain and restrictions, as well as improve ROM -P/ROM: supine - flexion, abduction, er/IR, horizontal abduction, x10 -AA/ROM: supine - flexion, abduction, protraction, horizontal abduction, er/IR, x10 -Low Wall Washes: 2x60 -Ball Rolls: flexion, abduction, x15     PATIENT EDUCATION: Education details: AA/ROM Person educated: Patient Education method: Programmer, Multimedia, Facilities Manager, and Handouts Education comprehension: verbalized understanding, returned demonstration, and needs further education  HOME EXERCISE PROGRAM: Table Slides and Pendulums 12/4: AA/ROM  GOALS: Goals reviewed with patient? Yes   SHORT TERM GOALS: Target date: 12/12/24  Pt will be provided with and educated on HEP to improve mobility in RUE required for use during ADL completion.   Goal status: IN  PROGRESS  2.  Pt will increase RUE P/ROM by 20 degrees to improve ability to use RUE during dressing tasks with minimal compensatory techniques.   Goal status: IN PROGRESS  3.  Pt will increase RUE strength to 3+/5 to improve ability to reach for items at waist to chest height during bathing and grooming tasks.   Goal status: IN PROGRESS  LONG TERM GOALS: Target date: 01/12/25  Pt will decrease pain in RUE to 3/10 or less to improve ability to sleep for 2+ consecutive hours without waking due to pain.   Goal status: IN PROGRESS  2.  Pt will decrease RUE fascial restrictions to min amounts or less to improve mobility required  for functional reaching tasks.   Goal status: IN PROGRESS  3.  Pt will increase RUE A/ROM by 25 degrees to improve ability to use RUE when reaching overhead or behind back during dressing and bathing tasks.   Goal status: IN PROGRESS  4.  Pt will increase RUE strength to 5/5 or greater to improve ability to use RUE when lifting or carrying items during meal preparation/housework/yardwork tasks.   Goal status: IN PROGRESS  5.  Pt will return to highest level of function using RUE as dominant during functional task completion.   Goal status: IN PROGRESS   ASSESSMENT:  CLINICAL IMPRESSION: This session pt reported that he has been more sore than normal, with moderate fascial restrictions noted along his biceps, axillary region, and trapezius addressed with manual therapy. He is able to achieve full ROM passively with minimal discomfort. Once completing Assisted ROM he did have difficulty with abduction and external rotation. Abduction he achieved 65% of full range and external rotation approximately 45% of full range with increased discomfort. Pt was able to tolerate below shoulder height ROM exercises actively this session with mild increased pain. OT providing verbal and tactile cuing for positioning and technique.   PERFORMANCE DEFICITS: in functional skills  including in functional skills including ADLs, IADLs, coordination, tone, ROM, strength, pain, fascial restrictions, muscle spasms, and UE functional use.  PLAN:  OT FREQUENCY: 2x/week  OT DURATION: 8 weeks  PLANNED INTERVENTIONS: 97168 OT Re-evaluation, 97535 self care/ADL training, 02889 therapeutic exercise, 97530 therapeutic activity, 97112 neuromuscular re-education, 97140 manual therapy, 97035 ultrasound, 97010 moist heat, 97032 electrical stimulation (manual), passive range of motion, functional mobility training, energy conservation, coping strategies training, patient/family education, and DME and/or AE instructions  CONSULTED AND AGREED WITH PLAN OF CARE: Patient  PLAN FOR NEXT SESSION: Manual Therapy, P/ROM, Mercer Orts, Wall washes; AA/ROM progressing to A/ROM as able    Valentin Nightingale, OTR/L  [phone] 11/27/2024, 10:42 AM

## 2024-11-29 ENCOUNTER — Encounter (HOSPITAL_COMMUNITY): Payer: Self-pay | Admitting: Occupational Therapy

## 2024-11-29 ENCOUNTER — Ambulatory Visit (HOSPITAL_COMMUNITY): Admitting: Occupational Therapy

## 2024-11-29 DIAGNOSIS — M25511 Pain in right shoulder: Secondary | ICD-10-CM

## 2024-11-29 DIAGNOSIS — M25611 Stiffness of right shoulder, not elsewhere classified: Secondary | ICD-10-CM

## 2024-11-29 DIAGNOSIS — R29898 Other symptoms and signs involving the musculoskeletal system: Secondary | ICD-10-CM

## 2024-11-29 NOTE — Therapy (Signed)
 OUTPATIENT OCCUPATIONAL THERAPY ORTHO TREATMENT NOTE  Patient Name: Kyle Ellis MRN: [id number] DOB:01-23-1981, 43 y.o., male Today's Date: 11/29/2024   END OF SESSION:  OT End of Session - 11/29/24 1027     Visit Number 6    Number of Visits 16    Date for Recertification  01/12/25   progress note 12/12/24   Authorization Type [employer]    Authorization Time Period 16 visits (11/24-1/19)    Authorization - Visit Number 5    Authorization - Number of Visits 16    OT Start Time 0905    OT Stop Time 0950    OT Time Calculation (min) 45 min    Activity Tolerance Patient tolerated treatment well    Behavior During Therapy [facility] for tasks assessed/performed          Past Medical History:  Diagnosis Date   Abscess of right olecranon bursa 05/19/2024   History of kidney stones    Past Surgical History:  Procedure Laterality Date   CYSTOSCOPY WITH RETROGRADE PYELOGRAM, URETEROSCOPY AND STENT PLACEMENT Left 07/11/2020   Procedure: CYSTOSCOPY WITH RETROGRADE PYELOGRAM, URETEROSCOPY AND STENT PLACEMENT;  Surgeon: Alvaro Hummer, MD;  Location: [facility];  Service: Urology;  Laterality: Left;  1 HR   ELBOW SURGERY Right    HERNIA REPAIR     20 years ago   HOLMIUM LASER APPLICATION Left 07/11/2020   Procedure: HOLMIUM LASER APPLICATION;  Surgeon: Alvaro Hummer, MD;  Location: [facility];  Service: Urology;  Laterality: Left;   INCISION AND DRAINAGE, BELOW ELBOW, BURSA Right 05/19/2024   Procedure: INCISION AND DRAINAGE, ELBOW, BURSA;  Surgeon: Josefina Chew, MD;  Location: [facility];  Service: Orthopedics;  Laterality: Right;   OLECRANON BURSECTOMY Right 05/19/2024   Procedure: JENELLE SPAIN;  Surgeon: Josefina Chew, MD;  Location: [facility];  Service: Orthopedics;  Laterality: Right;   piece of spine removed      2019 - done at surgical center in [place] by dr elsner    Patient Active Problem List   Diagnosis Date Noted   Abscess of right olecranon bursa 05/19/2024    PCP: [facility] REFERRING PROVIDER: Cristy Bonner DASEN, MD  ONSET DATE: 09/26/24  REFERRING DIAG: S01.109 (ICD-10-CM) - Other specified postprocedural states   THERAPY DIAG:  Acute pain of right shoulder  Shoulder stiffness, right  Other symptoms and signs involving the musculoskeletal system  Rationale for Evaluation and Treatment: Rehabilitation  SUBJECTIVE:   SUBJECTIVE STATEMENT: S: I can't move my arm to even put my key in the ignition  PERTINENT HISTORY: Pt s/p R massive RCR, Biceps tendon transfer, DCE, SA on 09/26/24  PRECAUTIONS: Shoulder  WEIGHT BEARING RESTRICTIONS: Yes >2lbs  PAIN:  Are you having pain? Yes: NPRS scale: 4/10 Pain location: anterior shoulder girdle/biceps Pain description: Sore Aggravating factors: movement Relieving factors: rest   FALLS: Has patient fallen in last 6 months? No  PLOF: Independent  PATIENT GOALS: To use his arm normally  NEXT MD VISIT: 12/25/24  OBJECTIVE:   HAND DOMINANCE: Right  ADLs: Overall ADLs: Pt reports being unable to reach his arm forward and put his key in the ignition, Pt unable reach up limiting dressing and bathing. Pt unable to lift and carry any pots and pans, groceries, or yard tools due to pain and protocol   FUNCTIONAL OUTCOME MEASURES: Quick Dash: 47.73  UPPER EXTREMITY ROM:       Assessed in supine, er/IR adducted  Passive ROM Right eval  Shoulder  flexion 132  Shoulder abduction 121  Shoulder internal rotation 90  Shoulder external rotation 10  (Blank rows = not tested)    UPPER EXTREMITY MMT:     Assessed in seated, er/IR adducted  MMT Right eval  Shoulder flexion   Shoulder abduction   Shoulder internal rotation   Shoulder external rotation   (Blank rows = not tested)  11/12/24: unable to test due to protocol  SENSATION: WFL  EDEMA: No swelling noted  OBSERVATIONS: Severe fascial restrictions noted along the biceps and deltoid, moderate fascial restrictions along  the scapular region and trapezius.    TODAY'S TREATMENT:                                                                                                                              DATE:  11/29/24 -Manual Therapy: myofascial release and trigger point applied to biceps, deltoid, and scapular region in order to reduce pain and restrictions, as well as improve ROM -P/ROM: supine - flexion, abduction, er/IR, horizontal abduction, 10 reps -AA/ROM: seated - flexion, abduction, protraction, horizontal abduction, er/IR, 10 reps  11/27/24 -Manual Therapy: myofascial release and trigger point applied to biceps, deltoid, and scapular region in order to reduce pain and restrictions, as well as improve ROM -P/ROM: supine - flexion, abduction, er/IR, horizontal abduction, 10 reps -AA/ROM: seated - flexion, abduction, protraction, horizontal abduction, er/IR, 10 reps -A/ROM: seated - protraction, flexion to 90*, er/IR, 10 reps  11/22/24 -Manual Therapy: myofascial release and trigger point applied to biceps, deltoid, and scapular region in order to reduce pain and restrictions, as well as improve ROM -P/ROM: supine - flexion, abduction, er/IR, horizontal abduction, 10 reps -AA/ROM: supine - flexion, abduction, protraction, horizontal abduction, er/IR, 12-15 reps -AA/ROM: standing- flexion, abduction, protraction, horizontal abduction, er/IR, 10 reps   PATIENT EDUCATION: Education details: AA/ROM Person educated: Patient Education method: Programmer, Multimedia, Facilities Manager, and Handouts Education comprehension: verbalized understanding, returned demonstration, and needs further education  HOME EXERCISE PROGRAM: Table Slides and Pendulums 12/4: AA/ROM  GOALS: Goals reviewed with patient? Yes   SHORT TERM GOALS: Target date: 12/12/24  Pt will be provided with and educated on HEP to improve mobility in RUE required for use during ADL completion.   Goal status: IN PROGRESS  2.  Pt will increase RUE  P/ROM by 20 degrees to improve ability to use RUE during dressing tasks with minimal compensatory techniques.   Goal status: IN PROGRESS  3.  Pt will increase RUE strength to 3+/5 to improve ability to reach for items at waist to chest height during bathing and grooming tasks.   Goal status: IN PROGRESS  LONG TERM GOALS: Target date: 01/12/25  Pt will decrease pain in RUE to 3/10 or less to improve ability to sleep for 2+ consecutive hours without waking due to pain.   Goal status: IN PROGRESS  2.  Pt will decrease RUE fascial restrictions to min amounts or less to improve mobility required for functional  reaching tasks.   Goal status: IN PROGRESS  3.  Pt will increase RUE A/ROM by 25 degrees to improve ability to use RUE when reaching overhead or behind back during dressing and bathing tasks.   Goal status: IN PROGRESS  4.  Pt will increase RUE strength to 5/5 or greater to improve ability to use RUE when lifting or carrying items during meal preparation/housework/yardwork tasks.   Goal status: IN PROGRESS  5.  Pt will return to highest level of function using RUE as dominant during functional task completion.   Goal status: IN PROGRESS   ASSESSMENT:  CLINICAL IMPRESSION: Pt presents to OT session with continued stiffness and reporting/demonstrating inability/difficulty with flexing his elbow and bringing his hand to his mouth or reaching straight out. Pt is unable to bring a drink to his mouth or put his key in the ignition without assisting his arm. OT requested that pt follow up with his MD to double check that healing is still progressing well. Remainder of session focused on prolonged manual therapy to reduce moderate to severe fascial restrictions and decrease stiffness, along with P/ROM and AA/ROM to focus on stretching out to his normal ranges. Verbal and tactile cuing provided for positioning and technique intermittently throughout session.   PERFORMANCE DEFICITS: in  functional skills including in functional skills including ADLs, IADLs, coordination, tone, ROM, strength, pain, fascial restrictions, muscle spasms, and UE functional use.  PLAN:  OT FREQUENCY: 2x/week  OT DURATION: 8 weeks  PLANNED INTERVENTIONS: 97168 OT Re-evaluation, 97535 self care/ADL training, 02889 therapeutic exercise, 97530 therapeutic activity, 97112 neuromuscular re-education, 97140 manual therapy, 97035 ultrasound, 97010 moist heat, 97032 electrical stimulation (manual), passive range of motion, functional mobility training, energy conservation, coping strategies training, patient/family education, and DME and/or AE instructions  CONSULTED AND AGREED WITH PLAN OF CARE: Patient  PLAN FOR NEXT SESSION: Manual Therapy, P/ROM, Mercer Orts, Wall washes; AA/ROM progressing to A/ROM as able    Valentin Nightingale, OTR/L  [phone] 11/29/2024, 10:29 AM

## 2024-12-03 ENCOUNTER — Ambulatory Visit (HOSPITAL_COMMUNITY): Admitting: Occupational Therapy

## 2024-12-03 ENCOUNTER — Encounter (HOSPITAL_COMMUNITY): Payer: Self-pay | Admitting: Occupational Therapy

## 2024-12-03 DIAGNOSIS — M25511 Pain in right shoulder: Secondary | ICD-10-CM

## 2024-12-03 DIAGNOSIS — M25611 Stiffness of right shoulder, not elsewhere classified: Secondary | ICD-10-CM

## 2024-12-03 DIAGNOSIS — R29898 Other symptoms and signs involving the musculoskeletal system: Secondary | ICD-10-CM

## 2024-12-03 NOTE — Therapy (Unsigned)
 OUTPATIENT OCCUPATIONAL THERAPY ORTHO TREATMENT NOTE  Patient Name: Kyle Ellis MRN: [id number] DOB:12-21-1980, 43 y.o., male Today's Date: 12/04/2024   END OF SESSION:   12/03/24 0949  OT Visits / Re-Eval  Visit Number 7  Number of Visits 16  Date for Recertification  01/12/25 (progress note 12/12/24)  Authorization  Authorization Type [facility] Medicaid  Authorization Time Period 16 visits (11/24-1/19)  Authorization - Visit Number 6  Authorization - Number of Visits 16  OT Time Calculation  OT Start Time 0904  OT Stop Time 0949  OT Time Calculation (min) 45 min  End of Session  Activity Tolerance Patient tolerated treatment well  Behavior During Therapy [facility] for tasks assessed/performed     Past Medical History:  Diagnosis Date   Abscess of right olecranon bursa 05/19/2024   History of kidney stones    Past Surgical History:  Procedure Laterality Date   CYSTOSCOPY WITH RETROGRADE PYELOGRAM, URETEROSCOPY AND STENT PLACEMENT Left 07/11/2020   Procedure: CYSTOSCOPY WITH RETROGRADE PYELOGRAM, URETEROSCOPY AND STENT PLACEMENT;  Surgeon: Alvaro Hummer, MD;  Location: [facility];  Service: Urology;  Laterality: Left;  1 HR   ELBOW SURGERY Right    HERNIA REPAIR     20 years ago   HOLMIUM LASER APPLICATION Left 07/11/2020   Procedure: HOLMIUM LASER APPLICATION;  Surgeon: Alvaro Hummer, MD;  Location: [facility];  Service: Urology;  Laterality: Left;   INCISION AND DRAINAGE, BELOW ELBOW, BURSA Right 05/19/2024   Procedure: INCISION AND DRAINAGE, ELBOW, BURSA;  Surgeon: Josefina Chew, MD;  Location: [facility];  Service: Orthopedics;  Laterality: Right;   OLECRANON BURSECTOMY Right 05/19/2024   Procedure: JENELLE SPAIN;  Surgeon: Josefina Chew, MD;  Location: [facility];  Service: Orthopedics;  Laterality: Right;   piece of spine removed      2019 - done at surgical center in [place] by dr elsner    Patient Active Problem List   Diagnosis Date Noted   Abscess of right olecranon bursa  05/19/2024    PCP: [facility] REFERRING PROVIDER: Cristy Bonner DASEN, MD  ONSET DATE: 09/26/24  REFERRING DIAG: S01.109 (ICD-10-CM) - Other specified postprocedural states   THERAPY DIAG:  Acute pain of right shoulder  Shoulder stiffness, right  Other symptoms and signs involving the musculoskeletal system  Rationale for Evaluation and Treatment: Rehabilitation  SUBJECTIVE:   SUBJECTIVE STATEMENT: S: It is really really bothering me still.  PERTINENT HISTORY: Pt s/p R massive RCR, Biceps tendon transfer, DCE, SA on 09/26/24  PRECAUTIONS: Shoulder  WEIGHT BEARING RESTRICTIONS: Yes >2lbs  PAIN:  Are you having pain? Yes: NPRS scale: 5/10 Pain location: anterior shoulder girdle/biceps Pain description: Sore Aggravating factors: movement Relieving factors: rest   FALLS: Has patient fallen in last 6 months? No  PLOF: Independent  PATIENT GOALS: To use his arm normally  NEXT MD VISIT: 12/25/24  OBJECTIVE:   HAND DOMINANCE: Right  ADLs: Overall ADLs: Pt reports being unable to reach his arm forward and put his key in the ignition, Pt unable reach up limiting dressing and bathing. Pt unable to lift and carry any pots and pans, groceries, or yard tools due to pain and protocol   FUNCTIONAL OUTCOME MEASURES: Quick Dash: 47.73  UPPER EXTREMITY ROM:       Assessed in supine, er/IR adducted  Passive ROM Right eval  Shoulder flexion 132  Shoulder abduction 121  Shoulder internal rotation 90  Shoulder external rotation 10  (Blank rows = not tested)    UPPER  EXTREMITY MMT:     Assessed in seated, er/IR adducted  MMT Right eval  Shoulder flexion   Shoulder abduction   Shoulder internal rotation   Shoulder external rotation   (Blank rows = not tested)  11/12/24: unable to test due to protocol  SENSATION: WFL  EDEMA: No swelling noted  OBSERVATIONS: Severe fascial restrictions noted along the biceps and deltoid, moderate fascial  restrictions along the scapular region and trapezius.    TODAY'S TREATMENT:                                                                                                                              DATE:   12/03/24 -Manual Therapy: myofascial release and trigger point applied to biceps, deltoid, and scapular region in order to reduce pain and restrictions, as well as improve ROM -P/ROM: supine - flexion, abduction, er/IR, horizontal abduction, 10 reps -AA/ROM: supine - flexion, abduction, protraction, horizontal abduction, er/IR, 10 reps -A/ROM: side lying - flexion, protraction, abduction, horizontal abduction, er/IR, x10  11/29/24 -Manual Therapy: myofascial release and trigger point applied to biceps, deltoid, and scapular region in order to reduce pain and restrictions, as well as improve ROM -P/ROM: supine - flexion, abduction, er/IR, horizontal abduction, 10 reps -AA/ROM: seated - flexion, abduction, protraction, horizontal abduction, er/IR, 10 reps  11/27/24 -Manual Therapy: myofascial release and trigger point applied to biceps, deltoid, and scapular region in order to reduce pain and restrictions, as well as improve ROM -P/ROM: supine - flexion, abduction, er/IR, horizontal abduction, 10 reps -AA/ROM: seated - flexion, abduction, protraction, horizontal abduction, er/IR, 10 reps -A/ROM: seated - protraction, flexion to 90*, er/IR, 10 reps   PATIENT EDUCATION: Education details: AA/ROM Person educated: Patient Education method: Programmer, Multimedia, Facilities Manager, and Handouts Education comprehension: verbalized understanding, returned demonstration, and needs further education  HOME EXERCISE PROGRAM: Table Slides and Pendulums 12/4: AA/ROM  GOALS: Goals reviewed with patient? Yes   SHORT TERM GOALS: Target date: 12/12/24  Pt will be provided with and educated on HEP to improve mobility in RUE required for use during ADL completion.   Goal status: IN PROGRESS  2.  Pt will  increase RUE P/ROM by 20 degrees to improve ability to use RUE during dressing tasks with minimal compensatory techniques.   Goal status: IN PROGRESS  3.  Pt will increase RUE strength to 3+/5 to improve ability to reach for items at waist to chest height during bathing and grooming tasks.   Goal status: IN PROGRESS  LONG TERM GOALS: Target date: 01/12/25  Pt will decrease pain in RUE to 3/10 or less to improve ability to sleep for 2+ consecutive hours without waking due to pain.   Goal status: IN PROGRESS  2.  Pt will decrease RUE fascial restrictions to min amounts or less to improve mobility required for functional reaching tasks.   Goal status: IN PROGRESS  3.  Pt will increase RUE A/ROM by 25 degrees to improve ability to use  RUE when reaching overhead or behind back during dressing and bathing tasks.   Goal status: IN PROGRESS  4.  Pt will increase RUE strength to 5/5 or greater to improve ability to use RUE when lifting or carrying items during meal preparation/housework/yardwork tasks.   Goal status: IN PROGRESS  5.  Pt will return to highest level of function using RUE as dominant during functional task completion.   Goal status: IN PROGRESS   ASSESSMENT:  CLINICAL IMPRESSION: This session pt demonstrated continued stiffness with pain in his end ranges. Passively and assisted ROM he is able to achieve near full ROM. Due to stiffness OT had pt complete A/ROM in side lying to complete ROM not against gravity. OT and pt discussed reducing HEP for a few days to allow arm to rest and reduce stiffness. Verbal and tactile cuing provided for positioning and technique.   PERFORMANCE DEFICITS: in functional skills including in functional skills including ADLs, IADLs, coordination, tone, ROM, strength, pain, fascial restrictions, muscle spasms, and UE functional use.  PLAN:  OT FREQUENCY: 2x/week  OT DURATION: 8 weeks  PLANNED INTERVENTIONS: 97168 OT Re-evaluation, 97535 self  care/ADL training, 02889 therapeutic exercise, 97530 therapeutic activity, 97112 neuromuscular re-education, 97140 manual therapy, 97035 ultrasound, 97010 moist heat, 97032 electrical stimulation (manual), passive range of motion, functional mobility training, energy conservation, coping strategies training, patient/family education, and DME and/or AE instructions  CONSULTED AND AGREED WITH PLAN OF CARE: Patient  PLAN FOR NEXT SESSION: Manual Therapy, P/ROM, Mercer Orts, Wall washes; AA/ROM progressing to A/ROM as able    Valentin Nightingale, OTR/L  [phone] 12/04/2024, 9:56 PM

## 2024-12-03 NOTE — Patient Instructions (Signed)

## 2024-12-06 ENCOUNTER — Ambulatory Visit (HOSPITAL_COMMUNITY): Admitting: Occupational Therapy

## 2024-12-06 ENCOUNTER — Encounter (HOSPITAL_COMMUNITY): Payer: Self-pay | Admitting: Occupational Therapy

## 2024-12-06 DIAGNOSIS — R29898 Other symptoms and signs involving the musculoskeletal system: Secondary | ICD-10-CM

## 2024-12-06 DIAGNOSIS — M25511 Pain in right shoulder: Secondary | ICD-10-CM

## 2024-12-06 DIAGNOSIS — M25611 Stiffness of right shoulder, not elsewhere classified: Secondary | ICD-10-CM

## 2024-12-06 NOTE — Patient Instructions (Signed)

## 2024-12-06 NOTE — Therapy (Signed)
 OUTPATIENT OCCUPATIONAL THERAPY ORTHO TREATMENT NOTE  Patient Name: Kyle Ellis MRN: [id number] DOB:05-21-1981, 43 y.o., male Today's Date: 12/06/2024   END OF SESSION:  OT End of Session - 12/06/24 0951     Visit Number 8    Number of Visits 16    Date for Recertification  01/12/25   progress note 12/12/24   Authorization Type [employer] Medicaid    Authorization Time Period 16 visits (11/24-1/19)    Authorization - Visit Number 7    Authorization - Number of Visits 16    OT Start Time 0900    OT Stop Time 0944    OT Time Calculation (min) 44 min    Activity Tolerance Patient tolerated treatment well    Behavior During Therapy [facility] for tasks assessed/performed          Past Medical History:  Diagnosis Date   Abscess of right olecranon bursa 05/19/2024   History of kidney stones    Past Surgical History:  Procedure Laterality Date   CYSTOSCOPY WITH RETROGRADE PYELOGRAM, URETEROSCOPY AND STENT PLACEMENT Left 07/11/2020   Procedure: CYSTOSCOPY WITH RETROGRADE PYELOGRAM, URETEROSCOPY AND STENT PLACEMENT;  Surgeon: Alvaro Hummer, MD;  Location: [facility];  Service: Urology;  Laterality: Left;  1 HR   ELBOW SURGERY Right    HERNIA REPAIR     20 years ago   HOLMIUM LASER APPLICATION Left 07/11/2020   Procedure: HOLMIUM LASER APPLICATION;  Surgeon: Alvaro Hummer, MD;  Location: [facility];  Service: Urology;  Laterality: Left;   INCISION AND DRAINAGE, BELOW ELBOW, BURSA Right 05/19/2024   Procedure: INCISION AND DRAINAGE, ELBOW, BURSA;  Surgeon: Josefina Chew, MD;  Location: [facility];  Service: Orthopedics;  Laterality: Right;   OLECRANON BURSECTOMY Right 05/19/2024   Procedure: JENELLE SPAIN;  Surgeon: Josefina Chew, MD;  Location: [facility];  Service: Orthopedics;  Laterality: Right;   piece of spine removed      2019 - done at surgical center in [place] by dr elsner    Patient Active Problem List   Diagnosis Date Noted   Abscess of right olecranon bursa 05/19/2024    [facility] REFERRING PROVIDER: Cristy Bonner DASEN, MD  ONSET DATE: 09/26/24  REFERRING DIAG: S01.109 (ICD-10-CM) - Other specified postprocedural states   THERAPY DIAG:  Acute pain of right shoulder  Shoulder stiffness, right  Other symptoms and signs involving the musculoskeletal system  Rationale for Evaluation and Treatment: Rehabilitation  SUBJECTIVE:   SUBJECTIVE STATEMENT: S: I had an injection Tuesday and it's already feeling better  PERTINENT HISTORY: Pt s/p R massive RCR, Biceps tendon transfer, DCE, SA on 09/26/24  PRECAUTIONS: Shoulder  WEIGHT BEARING RESTRICTIONS: Yes >2lbs  PAIN:  Are you having pain? Yes: NPRS scale: 2/10 Pain location: anterior shoulder girdle/biceps Pain description: Sore Aggravating factors: movement Relieving factors: rest   FALLS: Has patient fallen in last 6 months? No  PLOF: Independent  PATIENT GOALS: To use his arm normally  NEXT MD VISIT: 12/25/24  OBJECTIVE:   HAND DOMINANCE: Right  ADLs: Overall ADLs: Pt reports being unable to reach his arm forward and put his key in the ignition, Pt unable reach up limiting dressing and bathing. Pt unable to lift and carry any pots and pans, groceries, or yard tools due to pain and protocol   FUNCTIONAL OUTCOME MEASURES: Quick Dash: 47.73  UPPER EXTREMITY ROM:       Assessed in supine, er/IR adducted  Passive ROM Right eval  Shoulder flexion 132  Shoulder abduction 121  Shoulder internal rotation 90  Shoulder external rotation 10  (Blank rows = not tested)    UPPER EXTREMITY MMT:     Assessed in seated, er/IR adducted  MMT Right eval  Shoulder flexion   Shoulder abduction   Shoulder internal rotation   Shoulder external rotation   (Blank rows = not tested)  11/12/24: unable to test due to protocol  SENSATION: WFL  EDEMA: No swelling noted  OBSERVATIONS: Severe fascial restrictions noted along the biceps and deltoid, moderate fascial restrictions  along the scapular region and trapezius.    TODAY'S TREATMENT:                                                                                                                              DATE:   12/06/24 -Manual Therapy: myofascial release and trigger point applied to biceps, deltoid, and scapular region in order to reduce pain and restrictions, as well as improve ROM -P/ROM: supine - flexion, abduction, er/IR, horizontal abduction, 10 reps -AA/ROM: supine - flexion, abduction, protraction, horizontal abduction, er/IR, x15 -A/ROM: supine - flexion, protraction, abduction, horizontal abduction, er/IR, x10  12/03/24 -Manual Therapy: myofascial release and trigger point applied to biceps, deltoid, and scapular region in order to reduce pain and restrictions, as well as improve ROM -P/ROM: supine - flexion, abduction, er/IR, horizontal abduction, 10 reps -AA/ROM: supine - flexion, abduction, protraction, horizontal abduction, er/IR, 10 reps -A/ROM: side lying - flexion, protraction, abduction, horizontal abduction, er/IR, x10  11/29/24 -Manual Therapy: myofascial release and trigger point applied to biceps, deltoid, and scapular region in order to reduce pain and restrictions, as well as improve ROM -P/ROM: supine - flexion, abduction, er/IR, horizontal abduction, 10 reps -AA/ROM: seated - flexion, abduction, protraction, horizontal abduction, er/IR, 10 reps   PATIENT EDUCATION: Education details: A/ROM Person educated: Patient Education method: Programmer, Multimedia, Facilities Manager, and Handouts Education comprehension: verbalized understanding, returned demonstration, and needs further education  HOME EXERCISE PROGRAM: Table Slides and Pendulums 12/4: AA/ROM 12/18: A/ROM  GOALS: Goals reviewed with patient? Yes   SHORT TERM GOALS: Target date: 12/12/24  Pt will be provided with and educated on HEP to improve mobility in RUE required for use during ADL completion.   Goal status: IN  PROGRESS  2.  Pt will increase RUE P/ROM by 20 degrees to improve ability to use RUE during dressing tasks with minimal compensatory techniques.   Goal status: IN PROGRESS  3.  Pt will increase RUE strength to 3+/5 to improve ability to reach for items at waist to chest height during bathing and grooming tasks.   Goal status: IN PROGRESS  LONG TERM GOALS: Target date: 01/12/25  Pt will decrease pain in RUE to 3/10 or less to improve ability to sleep for 2+ consecutive hours without waking due to pain.   Goal status: IN PROGRESS  2.  Pt will decrease RUE fascial restrictions to min amounts or less to improve mobility required for functional  reaching tasks.   Goal status: IN PROGRESS  3.  Pt will increase RUE A/ROM by 25 degrees to improve ability to use RUE when reaching overhead or behind back during dressing and bathing tasks.   Goal status: IN PROGRESS  4.  Pt will increase RUE strength to 5/5 or greater to improve ability to use RUE when lifting or carrying items during meal preparation/housework/yardwork tasks.   Goal status: IN PROGRESS  5.  Pt will return to highest level of function using RUE as dominant during functional task completion.   Goal status: IN PROGRESS   ASSESSMENT:  CLINICAL IMPRESSION: Pt reported having an injection 2 days ago, which he feels has helped his pain and ROM. This session he demonstrated improved ROM. He tolerated full ROM passively and 85% of full ROM both assisted and actively. Mobility is more smooth this session and pain remained low throughout. Verbal and tactile cuing provided for positioning and technique.   PERFORMANCE DEFICITS: in functional skills including in functional skills including ADLs, IADLs, coordination, tone, ROM, strength, pain, fascial restrictions, muscle spasms, and UE functional use.  PLAN:  OT FREQUENCY: 2x/week  OT DURATION: 8 weeks  PLANNED INTERVENTIONS: 97168 OT Re-evaluation, 97535 self care/ADL training,  97110 therapeutic exercise, 97530 therapeutic activity, 97112 neuromuscular re-education, 97140 manual therapy, 97035 ultrasound, 02989 moist heat, 97032 electrical stimulation (manual), passive range of motion, functional mobility training, energy conservation, coping strategies training, patient/family education, and DME and/or AE instructions  CONSULTED AND AGREED WITH PLAN OF CARE: Patient  PLAN FOR NEXT SESSION: Manual Therapy, P/ROM, Mercer Orts, Wall washes; AA/ROM progressing to A/ROM as able    Valentin Nightingale, OTR/L  [phone] 12/06/2024, 9:52 AM

## 2024-12-10 ENCOUNTER — Ambulatory Visit (HOSPITAL_COMMUNITY): Admitting: Occupational Therapy

## 2024-12-12 ENCOUNTER — Ambulatory Visit (HOSPITAL_COMMUNITY): Admitting: Occupational Therapy

## 2024-12-17 ENCOUNTER — Ambulatory Visit (HOSPITAL_COMMUNITY): Admitting: Occupational Therapy

## 2024-12-19 ENCOUNTER — Ambulatory Visit (HOSPITAL_COMMUNITY): Admitting: Occupational Therapy

## 2024-12-21 ENCOUNTER — Ambulatory Visit (HOSPITAL_COMMUNITY): Attending: Orthopaedic Surgery | Admitting: Occupational Therapy

## 2024-12-21 ENCOUNTER — Telehealth (HOSPITAL_COMMUNITY): Payer: Self-pay | Admitting: Occupational Therapy

## 2024-12-21 DIAGNOSIS — M25511 Pain in right shoulder: Secondary | ICD-10-CM | POA: Insufficient documentation

## 2024-12-21 DIAGNOSIS — R29898 Other symptoms and signs involving the musculoskeletal system: Secondary | ICD-10-CM | POA: Insufficient documentation

## 2024-12-21 DIAGNOSIS — M25611 Stiffness of right shoulder, not elsewhere classified: Secondary | ICD-10-CM | POA: Insufficient documentation

## 2024-12-21 NOTE — Telephone Encounter (Signed)
 This OT called and spoke with patient regarding No Show on 12/21/24. Pt did not receive a reminder text regarding this visit and just arrived back home from a trip. Pt reminded of next appointment.   Valentin Nightingale, OTR/L [facility] [phone]

## 2024-12-24 ENCOUNTER — Encounter (HOSPITAL_COMMUNITY): Payer: Self-pay | Admitting: Occupational Therapy

## 2024-12-24 ENCOUNTER — Ambulatory Visit (HOSPITAL_COMMUNITY): Admitting: Occupational Therapy

## 2024-12-24 DIAGNOSIS — M25511 Pain in right shoulder: Secondary | ICD-10-CM | POA: Diagnosis present

## 2024-12-24 DIAGNOSIS — M25611 Stiffness of right shoulder, not elsewhere classified: Secondary | ICD-10-CM | POA: Diagnosis present

## 2024-12-24 DIAGNOSIS — R29898 Other symptoms and signs involving the musculoskeletal system: Secondary | ICD-10-CM

## 2024-12-24 NOTE — Therapy (Signed)
 " OUTPATIENT OCCUPATIONAL THERAPY ORTHO TREATMENT NOTE  Patient Name: Kyle Ellis MRN: [id number] DOB:[birth year], 44 y.o., male Today's Date: 12/24/2024   END OF SESSION:  OT End of Session - 12/24/24 1010     Visit Number 9    Number of Visits 16    Date for Recertification  01/12/25   progress note 12/12/24   Authorization Type [employer]    Authorization Time Period 16 visits (11/24-1/19)    Authorization - Visit Number 8    Authorization - Number of Visits 16    OT Start Time 0906    OT Stop Time 0949    OT Time Calculation (min) 43 min    Activity Tolerance Patient tolerated treatment well    Behavior During Therapy [facility] for tasks assessed/performed           Past Medical History:  Diagnosis Date   Abscess of right olecranon bursa 05/19/2024   History of kidney stones    Past Surgical History:  Procedure Laterality Date   CYSTOSCOPY WITH RETROGRADE PYELOGRAM, URETEROSCOPY AND STENT PLACEMENT Left 07/11/2020   Procedure: CYSTOSCOPY WITH RETROGRADE PYELOGRAM, URETEROSCOPY AND STENT PLACEMENT;  Surgeon: Alvaro Hummer, MD;  Location: [facility];  Service: Urology;  Laterality: Left;  1 HR   ELBOW SURGERY Right    HERNIA REPAIR     20 years ago   HOLMIUM LASER APPLICATION Left 07/11/2020   Procedure: HOLMIUM LASER APPLICATION;  Surgeon: Alvaro Hummer, MD;  Location: [facility];  Service: Urology;  Laterality: Left;   INCISION AND DRAINAGE, BELOW ELBOW, BURSA Right 05/19/2024   Procedure: INCISION AND DRAINAGE, ELBOW, BURSA;  Surgeon: Josefina Chew, MD;  Location: [facility];  Service: Orthopedics;  Laterality: Right;   OLECRANON BURSECTOMY Right 05/19/2024   Procedure: JENELLE SPAIN;  Surgeon: Josefina Chew, MD;  Location: [facility];  Service: Orthopedics;  Laterality: Right;   piece of spine removed      2019 - done at surgical center in [place] by dr elsner    Patient Active Problem List   Diagnosis Date Noted   Abscess of right olecranon bursa 05/19/2024    [facility] REFERRING PROVIDER: Cristy Bonner DASEN, MD  ONSET DATE: 09/26/24  REFERRING DIAG: S01.109 (ICD-10-CM) - Other specified postprocedural states   THERAPY DIAG:  Acute pain of right shoulder  Shoulder stiffness, right  Other symptoms and signs involving the musculoskeletal system  Rationale for Evaluation and Treatment: Rehabilitation  SUBJECTIVE:   SUBJECTIVE STATEMENT: S: I had an injection Tuesday and it's already feeling better  PERTINENT HISTORY: Pt s/p R massive RCR, Biceps tendon transfer, DCE, SA on 09/26/24  PRECAUTIONS: Shoulder  WEIGHT BEARING RESTRICTIONS: Yes >2lbs  PAIN:  Are you having pain? Yes: NPRS scale: 2/10 Pain location: anterior shoulder girdle/biceps Pain description: Sore Aggravating factors: movement Relieving factors: rest   FALLS: Has patient fallen in last 6 months? No  PLOF: Independent  PATIENT GOALS: To use his arm normally  NEXT MD VISIT: 12/25/24  OBJECTIVE:   HAND DOMINANCE: Right  ADLs: Overall ADLs: Pt reports being unable to reach his arm forward and put his key in the ignition, Pt unable reach up limiting dressing and bathing. Pt unable to lift and carry any pots and pans, groceries, or yard tools due to pain and protocol   FUNCTIONAL OUTCOME MEASURES: Quick Dash: 47.73  UPPER EXTREMITY ROM:       Assessed in supine, er/IR adducted  Passive ROM Right eval  Shoulder flexion  132  Shoulder abduction 121  Shoulder internal rotation 90  Shoulder external rotation 10  (Blank rows = not tested)    UPPER EXTREMITY MMT:     Assessed in seated, er/IR adducted  MMT Right eval  Shoulder flexion   Shoulder abduction   Shoulder internal rotation   Shoulder external rotation   (Blank rows = not tested)  11/12/24: unable to test due to protocol  SENSATION: WFL  EDEMA: No swelling noted  OBSERVATIONS: Severe fascial restrictions noted along the biceps and deltoid, moderate fascial restrictions  along the scapular region and trapezius.    TODAY'S TREATMENT:                                                                                                                              DATE:   12/24/24 -Manual Therapy: myofascial release and trigger point applied to biceps, deltoid, and scapular region in order to reduce pain and restrictions, as well as improve ROM -A/ROM: seated - flexion, protraction, abduction, horizontal abduction, er/IR, x10 -Scapular Strengthening: green band, extension, retraction, rows, x15 -Shoulder Strengthening: green band, horizontal abduction, er, IR, x15 -UBE: level 2, 2' forwards and backwards  12/06/24 -Manual Therapy: myofascial release and trigger point applied to biceps, deltoid, and scapular region in order to reduce pain and restrictions, as well as improve ROM -P/ROM: supine - flexion, abduction, er/IR, horizontal abduction, 10 reps -AA/ROM: supine - flexion, abduction, protraction, horizontal abduction, er/IR, x15 -A/ROM: supine - flexion, protraction, abduction, horizontal abduction, er/IR, x10  12/03/24 -Manual Therapy: myofascial release and trigger point applied to biceps, deltoid, and scapular region in order to reduce pain and restrictions, as well as improve ROM -P/ROM: supine - flexion, abduction, er/IR, horizontal abduction, 10 reps -AA/ROM: supine - flexion, abduction, protraction, horizontal abduction, er/IR, 10 reps -A/ROM: side lying - flexion, protraction, abduction, horizontal abduction, er/IR, x10    PATIENT EDUCATION: Education details: Therapist, Sports and Shoulder strengthening Person educated: Patient Education method: Explanation, Demonstration, and Handouts Education comprehension: verbalized understanding, returned demonstration, and needs further education  HOME EXERCISE PROGRAM: Table Slides and Pendulums 12/4: AA/ROM 12/18: A/ROM 1/5: Scapular and Shoulder Strengthening  GOALS: Goals reviewed with patient?  Yes   SHORT TERM GOALS: Target date: 12/12/24  Pt will be provided with and educated on HEP to improve mobility in RUE required for use during ADL completion.   Goal status: IN PROGRESS  2.  Pt will increase RUE P/ROM by 20 degrees to improve ability to use RUE during dressing tasks with minimal compensatory techniques.   Goal status: IN PROGRESS  3.  Pt will increase RUE strength to 3+/5 to improve ability to reach for items at waist to chest height during bathing and grooming tasks.   Goal status: IN PROGRESS  LONG TERM GOALS: Target date: 01/12/25  Pt will decrease pain in RUE to 3/10 or less to improve ability to sleep for 2+ consecutive hours without waking due to pain.  Goal status: IN PROGRESS  2.  Pt will decrease RUE fascial restrictions to min amounts or less to improve mobility required for functional reaching tasks.   Goal status: IN PROGRESS  3.  Pt will increase RUE A/ROM by 25 degrees to improve ability to use RUE when reaching overhead or behind back during dressing and bathing tasks.   Goal status: IN PROGRESS  4.  Pt will increase RUE strength to 5/5 or greater to improve ability to use RUE when lifting or carrying items during meal preparation/housework/yardwork tasks.   Goal status: IN PROGRESS  5.  Pt will return to highest level of function using RUE as dominant during functional task completion.   Goal status: IN PROGRESS   ASSESSMENT:  CLINICAL IMPRESSION: This session pt continues to report popping and clicking in his shoulder joint, as well as pain along the superior aspect of his shoulder. His ROM is 75-80% of full ROM actively with minimal discomfort. OT added resistance band exercises this session to begin strengthening and stabilizing his shoulder. Verbal and tactile cuing provided for positioning and technique throughout session.   PERFORMANCE DEFICITS: in functional skills including in functional skills including ADLs, IADLs, coordination,  tone, ROM, strength, pain, fascial restrictions, muscle spasms, and UE functional use.  PLAN:  OT FREQUENCY: 2x/week  OT DURATION: 8 weeks  PLANNED INTERVENTIONS: 97168 OT Re-evaluation, 97535 self care/ADL training, 02889 therapeutic exercise, 97530 therapeutic activity, 97112 neuromuscular re-education, 97140 manual therapy, 97035 ultrasound, 97010 moist heat, 97032 electrical stimulation (manual), passive range of motion, functional mobility training, energy conservation, coping strategies training, patient/family education, and DME and/or AE instructions  CONSULTED AND AGREED WITH PLAN OF CARE: Patient  PLAN FOR NEXT SESSION: Manual Therapy, P/ROM, Mercer Orts, Wall washes; AA/ROM progressing to A/ROM as able    Valentin Nightingale, OTR/L  [phone] 12/24/2024, 10:11 AM     "

## 2024-12-24 NOTE — Patient Instructions (Signed)
 1) (Home) Extension: Isometric / Bilateral Arm Retraction - Sitting ? ? ?Facing anchor, hold hands and elbow at shoulder height, with elbow bent.  Pull arms back to squeeze shoulder blades together. Repeat 10-15 times. 1-3 times/day.  ? ?2) (Clinic) Extension / Flexion (Assist) ? ? ?Face anchor, pull arms back, keeping elbow straight, and squeze shoulder blades together. ?Repeat 10-15 times. 1-3 times/day.  ? ?Copyright ? [employer]. All rights reserved.  ? ?3) (Home) Retraction: Row - Bilateral (Anchor) ? ? ?Facing anchor, arms reaching forward, pull hands toward stomach, keeping elbows bent and at your sides and pinching shoulder blades together. ?Repeat 10-15 times. 1-3 times/day.  ? ?Copyright ? [employer]. All rights reserved.  ? ? ? ? ?Theraband strengthening: Complete 10-15X, 1-2X/day ? ?1) Shoulder protraction ? ?Anchor band in doorway, stand with back to door. Push your hand forward as much as you can to bringing your shoulder blades forward on your rib cage. ? ? ? ? ? ?2) Shoulder horizontal abduction ? ?Standing with a theraband anchored at chest height, begin with arm straight and some tension in the band. Move your arm out to your side (keeping straight the whole time). Bring the affected arm back to midline. ? ? ? ? ?3) Shoulder Internal Rotation ? ?While holding an elastic band at your side with your elbow bent, start with your hand away from your stomach, then pull the band towards your stomach. Keep your elbow near your side the entire time. ? ? ? ? ?4) Shoulder External Rotation ? ?While holding an elastic band at your side with your elbow bent, start with your hand near your stomach and then pull the band away. Keep your elbow at your side the entire time. ? ? ? ? ?5) Shoulder flexion ? ?While standing with back to the door, holding Theraband at hand level, raise arm in front of you.  Keep elbow straight through entire movement.  ? ? ? ? ?6) Shoulder abduction ? ?While holding an elastic band at your side, draw  up your arm to the side keeping your elbow straight. ?  ? ?

## 2024-12-27 ENCOUNTER — Encounter (HOSPITAL_COMMUNITY): Payer: Self-pay | Admitting: Occupational Therapy

## 2024-12-27 ENCOUNTER — Ambulatory Visit (HOSPITAL_COMMUNITY): Admitting: Occupational Therapy

## 2024-12-27 DIAGNOSIS — R29898 Other symptoms and signs involving the musculoskeletal system: Secondary | ICD-10-CM

## 2024-12-27 DIAGNOSIS — M25511 Pain in right shoulder: Secondary | ICD-10-CM

## 2024-12-27 DIAGNOSIS — M25611 Stiffness of right shoulder, not elsewhere classified: Secondary | ICD-10-CM

## 2024-12-27 NOTE — Patient Instructions (Signed)

## 2024-12-27 NOTE — Therapy (Signed)
 " OUTPATIENT OCCUPATIONAL THERAPY ORTHO TREATMENT NOTE  Patient Name: Kyle Ellis MRN: [id number] DOB:Apr 11, 1981, 44 y.o., male Today's Date: 12/27/2024  Progress Note Reporting Period 10/2424 to 12/27/24  See note below for Objective Data and Assessment of Progress/Goals.    END OF SESSION:  OT End of Session - 12/27/24 1029     Visit Number 10    Number of Visits 16    Date for Recertification  01/12/25   progress note 12/12/24   Authorization Type [employer]    Authorization Time Period 16 visits (11/24-1/19)    Authorization - Visit Number 9    Authorization - Number of Visits 16    OT Start Time 0910    OT Stop Time 0951    OT Time Calculation (min) 41 min    Activity Tolerance Patient tolerated treatment well    Behavior During Therapy [facility] for tasks assessed/performed          Past Medical History:  Diagnosis Date   Abscess of right olecranon bursa 05/19/2024   History of kidney stones    Past Surgical History:  Procedure Laterality Date   CYSTOSCOPY WITH RETROGRADE PYELOGRAM, URETEROSCOPY AND STENT PLACEMENT Left 07/11/2020   Procedure: CYSTOSCOPY WITH RETROGRADE PYELOGRAM, URETEROSCOPY AND STENT PLACEMENT;  Surgeon: Alvaro Hummer, MD;  Location: [facility];  Service: Urology;  Laterality: Left;  1 HR   ELBOW SURGERY Right    HERNIA REPAIR     20 years ago   HOLMIUM LASER APPLICATION Left 07/11/2020   Procedure: HOLMIUM LASER APPLICATION;  Surgeon: Alvaro Hummer, MD;  Location: [facility];  Service: Urology;  Laterality: Left;   INCISION AND DRAINAGE, BELOW ELBOW, BURSA Right 05/19/2024   Procedure: INCISION AND DRAINAGE, ELBOW, BURSA;  Surgeon: Josefina Chew, MD;  Location: [facility];  Service: Orthopedics;  Laterality: Right;   OLECRANON BURSECTOMY Right 05/19/2024   Procedure: JENELLE SPAIN;  Surgeon: Josefina Chew, MD;  Location: [facility];  Service: Orthopedics;  Laterality: Right;   piece of spine removed      2019 - done at surgical center in [place] by dr elsner     Patient Active Problem List   Diagnosis Date Noted   Abscess of right olecranon bursa 05/19/2024    [facility]P: [facility] REFERRING PROVIDER: Cristy Bonner DASEN, MD  ONSET DATE: 09/26/24  REFERRING DIAG: S01.109 (ICD-10-CM) - Other specified postprocedural states   THERAPY DIAG:  Acute pain of right shoulder  Shoulder stiffness, right  Other symptoms and signs involving the musculoskeletal system  Rationale for Evaluation and Treatment: Rehabilitation  SUBJECTIVE:   SUBJECTIVE STATEMENT: S: It is really popping and hurting with the pops.  PERTINENT HISTORY: Pt s/p R massive RCR, Biceps tendon transfer, DCE, SA on 09/26/24  PRECAUTIONS: Shoulder  WEIGHT BEARING RESTRICTIONS: Yes >2lbs  PAIN:  Are you having pain? Yes: NPRS scale: 2/10 Pain location: anterior shoulder girdle/biceps Pain description: Sore Aggravating factors: movement Relieving factors: rest   FALLS: Has patient fallen in last 6 months? No  PLOF: Independent  PATIENT GOALS: To use his arm normally  NEXT MD VISIT: 12/25/24  OBJECTIVE:   HAND DOMINANCE: Right  ADLs: Overall ADLs: Pt reports being unable to reach his arm forward and put his key in the ignition, Pt unable reach up limiting dressing and bathing. Pt unable to lift and carry any pots and pans, groceries, or yard tools due to pain and protocol   FUNCTIONAL OUTCOME MEASURES: Quick Dash: 47.73  UPPER EXTREMITY ROM:  Assessed in supine, er/IR adducted  Passive ROM Right eval  Shoulder flexion 132  Shoulder abduction 121  Shoulder internal rotation 90  Shoulder external rotation 10  (Blank rows = not tested)    UPPER EXTREMITY MMT:     Assessed in seated, er/IR adducted  MMT Right eval  Shoulder flexion   Shoulder abduction   Shoulder internal rotation   Shoulder external rotation   (Blank rows = not tested)  11/12/24: unable to test due to protocol  SENSATION: WFL  EDEMA: No swelling  noted  OBSERVATIONS: Severe fascial restrictions noted along the biceps and deltoid, moderate fascial restrictions along the scapular region and trapezius.    TODAY'S TREATMENT:                                                                                                                              DATE:   12/27/24 -Manual Therapy: myofascial release and trigger point applied to biceps, deltoid, and scapular region in order to reduce pain and restrictions, as well as improve ROM -A/ROM: seated - flexion, protraction, abduction, horizontal abduction, er/IR, x10 -PNF Strengthening: green band, chest pulls, overhead pulls, PNF up, PNF down, er pulls, x12  12/24/24 -Manual Therapy: myofascial release and trigger point applied to biceps, deltoid, and scapular region in order to reduce pain and restrictions, as well as improve ROM -A/ROM: seated - flexion, protraction, abduction, horizontal abduction, er/IR, x10 -Scapular Strengthening: green band, extension, retraction, rows, x15 -Shoulder Strengthening: green band, horizontal abduction, er, IR, x15 -UBE: level 2, 2' forwards and backwards  12/06/24 -Manual Therapy: myofascial release and trigger point applied to biceps, deltoid, and scapular region in order to reduce pain and restrictions, as well as improve ROM -P/ROM: supine - flexion, abduction, er/IR, horizontal abduction, 10 reps -AA/ROM: supine - flexion, abduction, protraction, horizontal abduction, er/IR, x15 -A/ROM: supine - flexion, protraction, abduction, horizontal abduction, er/IR, x10   PATIENT EDUCATION: Education details: PNF Strengthening Person educated: Patient Education method: Programmer, Multimedia, Demonstration, and Handouts Education comprehension: verbalized understanding, returned demonstration, and needs further education  HOME EXERCISE PROGRAM: Table Slides and Pendulums 12/4: AA/ROM 12/18: A/ROM 1/5: Scapular and Shoulder Strengthening 1/8: PNF  Strengthening  GOALS: Goals reviewed with patient? Yes   SHORT TERM GOALS: Target date: 12/12/24  Pt will be provided with and educated on HEP to improve mobility in RUE required for use during ADL completion.   Goal status: IN PROGRESS  2.  Pt will increase RUE P/ROM by 20 degrees to improve ability to use RUE during dressing tasks with minimal compensatory techniques.   Goal status: IN PROGRESS  3.  Pt will increase RUE strength to 3+/5 to improve ability to reach for items at waist to chest height during bathing and grooming tasks.   Goal status: IN PROGRESS  LONG TERM GOALS: Target date: 01/12/25  Pt will decrease pain in RUE to 3/10 or less to improve ability to sleep for 2+ consecutive hours without  waking due to pain.   Goal status: IN PROGRESS  2.  Pt will decrease RUE fascial restrictions to min amounts or less to improve mobility required for functional reaching tasks.   Goal status: IN PROGRESS  3.  Pt will increase RUE A/ROM by 25 degrees to improve ability to use RUE when reaching overhead or behind back during dressing and bathing tasks.   Goal status: IN PROGRESS  4.  Pt will increase RUE strength to 5/5 or greater to improve ability to use RUE when lifting or carrying items during meal preparation/housework/yardwork tasks.   Goal status: IN PROGRESS  5.  Pt will return to highest level of function using RUE as dominant during functional task completion.   Goal status: IN PROGRESS   ASSESSMENT:  CLINICAL IMPRESSION: Pt is continuing to have increased popping in his shoulder with end range flexion and external rotation, as well as intermittently with abduction. The popping is now painful. OT advised to stop exercises or ranges that create the popping. His range continues to be near full, however strength and stability are limited. More resistance band exercises added to continue addressing functional strength. Verbal and tactile cuing for positioning and  technique.   PERFORMANCE DEFICITS: in functional skills including in functional skills including ADLs, IADLs, coordination, tone, ROM, strength, pain, fascial restrictions, muscle spasms, and UE functional use.  PLAN:  OT FREQUENCY: 2x/week  OT DURATION: 8 weeks  PLANNED INTERVENTIONS: 97168 OT Re-evaluation, 97535 self care/ADL training, 02889 therapeutic exercise, 97530 therapeutic activity, 97112 neuromuscular re-education, 97140 manual therapy, 97035 ultrasound, 97010 moist heat, 97032 electrical stimulation (manual), passive range of motion, functional mobility training, energy conservation, coping strategies training, patient/family education, and DME and/or AE instructions  CONSULTED AND AGREED WITH PLAN OF CARE: Patient  PLAN FOR NEXT SESSION: Manual Therapy, P/ROM, Mercer Orts, Wall washes; AA/ROM progressing to A/ROM as able    Valentin Nightingale, OTR/L  [phone] 12/27/2024, 10:30 AM     "

## 2025-01-01 ENCOUNTER — Encounter (HOSPITAL_COMMUNITY): Payer: Self-pay | Admitting: Occupational Therapy

## 2025-01-01 ENCOUNTER — Ambulatory Visit (HOSPITAL_COMMUNITY): Admitting: Occupational Therapy

## 2025-01-01 DIAGNOSIS — M25511 Pain in right shoulder: Secondary | ICD-10-CM | POA: Diagnosis not present

## 2025-01-01 DIAGNOSIS — M25611 Stiffness of right shoulder, not elsewhere classified: Secondary | ICD-10-CM

## 2025-01-01 DIAGNOSIS — R29898 Other symptoms and signs involving the musculoskeletal system: Secondary | ICD-10-CM

## 2025-01-01 NOTE — Therapy (Signed)
 " OUTPATIENT OCCUPATIONAL THERAPY ORTHO TREATMENT NOTE  Patient Name: ELO MARMOLEJOS MRN: [id number] DOB:1981/06/13, 44 y.o., male Today's Date: 01/01/2025  END OF SESSION:  OT End of Session - 01/01/25 1245     Visit Number 11    Number of Visits 16    Date for Recertification  01/12/25   progress note 12/12/24   Authorization Type [employer]    Authorization Time Period 16 visits (11/24-1/19)    Authorization - Visit Number 10    Authorization - Number of Visits 16    OT Start Time 0827    OT Stop Time 0908    OT Time Calculation (min) 41 min    Activity Tolerance Patient tolerated treatment well    Behavior During Therapy [facility] for tasks assessed/performed          Past Medical History:  Diagnosis Date   Abscess of right olecranon bursa 05/19/2024   History of kidney stones    Past Surgical History:  Procedure Laterality Date   CYSTOSCOPY WITH RETROGRADE PYELOGRAM, URETEROSCOPY AND STENT PLACEMENT Left 07/11/2020   Procedure: CYSTOSCOPY WITH RETROGRADE PYELOGRAM, URETEROSCOPY AND STENT PLACEMENT;  Surgeon: Alvaro Hummer, MD;  Location: [facility];  Service: Urology;  Laterality: Left;  1 HR   ELBOW SURGERY Right    HERNIA REPAIR     20 years ago   HOLMIUM LASER APPLICATION Left 07/11/2020   Procedure: HOLMIUM LASER APPLICATION;  Surgeon: Alvaro Hummer, MD;  Location: [facility];  Service: Urology;  Laterality: Left;   INCISION AND DRAINAGE, BELOW ELBOW, BURSA Right 05/19/2024   Procedure: INCISION AND DRAINAGE, ELBOW, BURSA;  Surgeon: Josefina Chew, MD;  Location: [facility];  Service: Orthopedics;  Laterality: Right;   OLECRANON BURSECTOMY Right 05/19/2024   Procedure: JENELLE SPAIN;  Surgeon: Josefina Chew, MD;  Location: [facility];  Service: Orthopedics;  Laterality: Right;   piece of spine removed      2019 - done at surgical center in [place] by dr elsner    Patient Active Problem List   Diagnosis Date Noted   Abscess of right olecranon bursa 05/19/2024    PCP: [facility] REFERRING PROVIDER: Cristy Bonner DASEN, MD  ONSET DATE: 09/26/24  REFERRING DIAG: S01.109 (ICD-10-CM) - Other specified postprocedural states   THERAPY DIAG:  Acute pain of right shoulder  Shoulder stiffness, right  Other symptoms and signs involving the musculoskeletal system  Rationale for Evaluation and Treatment: Rehabilitation  SUBJECTIVE:   SUBJECTIVE STATEMENT: S: It is really popping and hurting with the pops.  PERTINENT HISTORY: Pt s/p R massive RCR, Biceps tendon transfer, DCE, SA on 09/26/24  PRECAUTIONS: Shoulder  WEIGHT BEARING RESTRICTIONS: Yes >2lbs  PAIN:  Are you having pain? Yes: NPRS scale: 2/10 Pain location: anterior shoulder girdle/biceps Pain description: Sore Aggravating factors: movement Relieving factors: rest   FALLS: Has patient fallen in last 6 months? No  PLOF: Independent  PATIENT GOALS: To use his arm normally  NEXT MD VISIT: 12/25/24  OBJECTIVE:   HAND DOMINANCE: Right  ADLs: Overall ADLs: Pt reports being unable to reach his arm forward and put his key in the ignition, Pt unable reach up limiting dressing and bathing. Pt unable to lift and carry any pots and pans, groceries, or yard tools due to pain and protocol   FUNCTIONAL OUTCOME MEASURES: Quick Dash: 47.73  UPPER EXTREMITY ROM:       Assessed in supine, er/IR adducted  Passive ROM Right eval  Shoulder flexion 132  Shoulder  abduction 121  Shoulder internal rotation 90  Shoulder external rotation 10  (Blank rows = not tested)    UPPER EXTREMITY MMT:     Assessed in seated, er/IR adducted  MMT Right eval  Shoulder flexion   Shoulder abduction   Shoulder internal rotation   Shoulder external rotation   (Blank rows = not tested)  11/12/24: unable to test due to protocol  SENSATION: WFL  EDEMA: No swelling noted  OBSERVATIONS: Severe fascial restrictions noted along the biceps and deltoid, moderate fascial restrictions along the  scapular region and trapezius.    TODAY'S TREATMENT:                                                                                                                              DATE:   01/01/25 -Manual Therapy: myofascial release and trigger point applied to biceps, deltoid, and scapular region in order to reduce pain and restrictions, as well as improve ROM -A/ROM: seated - flexion, protraction, abduction, horizontal abduction, er/IR, x12 -X to V arms, x12 -Goal Post arms, x12 -Overhead lacing -UBE: level 2, 2' forwards and backwards  12/27/24 -Manual Therapy: myofascial release and trigger point applied to biceps, deltoid, and scapular region in order to reduce pain and restrictions, as well as improve ROM -A/ROM: seated - flexion, protraction, abduction, horizontal abduction, er/IR, x10 -PNF Strengthening: green band, chest pulls, overhead pulls, PNF up, PNF down, er pulls, x12  12/24/24 -Manual Therapy: myofascial release and trigger point applied to biceps, deltoid, and scapular region in order to reduce pain and restrictions, as well as improve ROM -A/ROM: seated - flexion, protraction, abduction, horizontal abduction, er/IR, x10 -Scapular Strengthening: green band, extension, retraction, rows, x15 -Shoulder Strengthening: green band, horizontal abduction, er, IR, x15 -UBE: level 2, 2' forwards and backwards   PATIENT EDUCATION: Education details: PNF Strengthening Person educated: Patient Education method: Explanation, Demonstration, and Handouts Education comprehension: verbalized understanding, returned demonstration, and needs further education  HOME EXERCISE PROGRAM: Table Slides and Pendulums 12/4: AA/ROM 12/18: A/ROM 1/5: Scapular and Shoulder Strengthening 1/8: PNF Strengthening  GOALS: Goals reviewed with patient? Yes   SHORT TERM GOALS: Target date: 12/12/24  Pt will be provided with and educated on HEP to improve mobility in RUE required for use during ADL  completion.   Goal status: IN PROGRESS  2.  Pt will increase RUE P/ROM by 20 degrees to improve ability to use RUE during dressing tasks with minimal compensatory techniques.   Goal status: IN PROGRESS  3.  Pt will increase RUE strength to 3+/5 to improve ability to reach for items at waist to chest height during bathing and grooming tasks.   Goal status: IN PROGRESS  LONG TERM GOALS: Target date: 01/12/25  Pt will decrease pain in RUE to 3/10 or less to improve ability to sleep for 2+ consecutive hours without waking due to pain.   Goal status: IN PROGRESS  2.  Pt will decrease RUE fascial  restrictions to min amounts or less to improve mobility required for functional reaching tasks.   Goal status: IN PROGRESS  3.  Pt will increase RUE A/ROM by 25 degrees to improve ability to use RUE when reaching overhead or behind back during dressing and bathing tasks.   Goal status: IN PROGRESS  4.  Pt will increase RUE strength to 5/5 or greater to improve ability to use RUE when lifting or carrying items during meal preparation/housework/yardwork tasks.   Goal status: IN PROGRESS  5.  Pt will return to highest level of function using RUE as dominant during functional task completion.   Goal status: IN PROGRESS   ASSESSMENT:  CLINICAL IMPRESSION: This session pt continues to have popping in his shoulder with all ROM, however no pain associated with the popping. He continues to achieve full ROM, except with external rotation, which is limited on both arms. He worked on endurance exercises this session with moderate fatigue after overhead lacing. No increase in pain. OT providing verbal and tactile cuing for positioning and technique.   PERFORMANCE DEFICITS: in functional skills including in functional skills including ADLs, IADLs, coordination, tone, ROM, strength, pain, fascial restrictions, muscle spasms, and UE functional use.  PLAN:  OT FREQUENCY: 2x/week  OT DURATION: 8  weeks  PLANNED INTERVENTIONS: 97168 OT Re-evaluation, 97535 self care/ADL training, 02889 therapeutic exercise, 97530 therapeutic activity, 97112 neuromuscular re-education, 97140 manual therapy, 97035 ultrasound, 97010 moist heat, 97032 electrical stimulation (manual), passive range of motion, functional mobility training, energy conservation, coping strategies training, patient/family education, and DME and/or AE instructions  CONSULTED AND AGREED WITH PLAN OF CARE: Patient  PLAN FOR NEXT SESSION: Manual Therapy, P/ROM, Mercer Orts, Wall washes; AA/ROM progressing to A/ROM as able    Valentin Nightingale, OTR/L  [phone] 01/01/2025, 12:46 PM     "

## 2025-01-08 ENCOUNTER — Ambulatory Visit (HOSPITAL_COMMUNITY): Admitting: Occupational Therapy

## 2025-01-08 ENCOUNTER — Encounter (HOSPITAL_COMMUNITY): Payer: Self-pay | Admitting: Occupational Therapy

## 2025-01-08 DIAGNOSIS — M25611 Stiffness of right shoulder, not elsewhere classified: Secondary | ICD-10-CM

## 2025-01-08 DIAGNOSIS — M25511 Pain in right shoulder: Secondary | ICD-10-CM

## 2025-01-08 DIAGNOSIS — R29898 Other symptoms and signs involving the musculoskeletal system: Secondary | ICD-10-CM

## 2025-01-08 NOTE — Therapy (Signed)
 " OUTPATIENT OCCUPATIONAL THERAPY ORTHO TREATMENT NOTE  Patient Name: Kyle Ellis MRN: [id number] DOB:22-Dec-1980, 44 y.o., male Today's Date: 01/08/2025  END OF SESSION:  OT End of Session - 01/08/25 1011     Visit Number 12    Number of Visits 16    Date for Recertification  01/12/25   progress note 12/12/24   Authorization Type [employer]    Authorization Time Period 16 visits (11/24-1/19)    Authorization - Visit Number 11    Authorization - Number of Visits 16    OT Start Time 0908    OT Stop Time 0948    OT Time Calculation (min) 40 min    Activity Tolerance Patient tolerated treatment well    Behavior During Therapy [facility] for tasks assessed/performed           Past Medical History:  Diagnosis Date   Abscess of right olecranon bursa 05/19/2024   History of kidney stones    Past Surgical History:  Procedure Laterality Date   CYSTOSCOPY WITH RETROGRADE PYELOGRAM, URETEROSCOPY AND STENT PLACEMENT Left 07/11/2020   Procedure: CYSTOSCOPY WITH RETROGRADE PYELOGRAM, URETEROSCOPY AND STENT PLACEMENT;  Surgeon: Alvaro Hummer, MD;  Location: [facility];  Service: Urology;  Laterality: Left;  1 HR   ELBOW SURGERY Right    HERNIA REPAIR     20 years ago   HOLMIUM LASER APPLICATION Left 07/11/2020   Procedure: HOLMIUM LASER APPLICATION;  Surgeon: Alvaro Hummer, MD;  Location: [facility];  Service: Urology;  Laterality: Left;   INCISION AND DRAINAGE, BELOW ELBOW, BURSA Right 05/19/2024   Procedure: INCISION AND DRAINAGE, ELBOW, BURSA;  Surgeon: Josefina Chew, MD;  Location: [facility];  Service: Orthopedics;  Laterality: Right;   OLECRANON BURSECTOMY Right 05/19/2024   Procedure: JENELLE SPAIN;  Surgeon: Josefina Chew, MD;  Location: [facility];  Service: Orthopedics;  Laterality: Right;   piece of spine removed      2019 - done at surgical center in [place] by dr elsner    Patient Active Problem List   Diagnosis Date Noted   Abscess of right olecranon bursa 05/19/2024    PCP: [facility] REFERRING PROVIDER: Cristy Bonner DASEN, MD  ONSET DATE: 09/26/24  REFERRING DIAG: S01.109 (ICD-10-CM) - Other specified postprocedural states   THERAPY DIAG:  Acute pain of right shoulder  Shoulder stiffness, right  Other symptoms and signs involving the musculoskeletal system  Rationale for Evaluation and Treatment: Rehabilitation  SUBJECTIVE:   SUBJECTIVE STATEMENT: S: The doctor said I needed to strengthen it more  PERTINENT HISTORY: Pt s/p R massive RCR, Biceps tendon transfer, DCE, SA on 09/26/24  PRECAUTIONS: Shoulder  WEIGHT BEARING RESTRICTIONS: Yes >2lbs  PAIN:  Are you having pain? Yes: NPRS scale: 2/10 Pain location: anterior shoulder girdle/biceps Pain description: Sore Aggravating factors: movement Relieving factors: rest   FALLS: Has patient fallen in last 6 months? No  PLOF: Independent  PATIENT GOALS: To use his arm normally  NEXT MD VISIT: 12/25/24  OBJECTIVE:   HAND DOMINANCE: Right  ADLs: Overall ADLs: Pt reports being unable to reach his arm forward and put his key in the ignition, Pt unable reach up limiting dressing and bathing. Pt unable to lift and carry any pots and pans, groceries, or yard tools due to pain and protocol   FUNCTIONAL OUTCOME MEASURES: Quick Dash: 47.73  UPPER EXTREMITY ROM:       Assessed in supine, er/IR adducted  Passive ROM Right eval  Shoulder flexion 132  Shoulder abduction 121  Shoulder internal rotation 90  Shoulder external rotation 10  (Blank rows = not tested)    UPPER EXTREMITY MMT:     Assessed in seated, er/IR adducted  MMT Right eval  Shoulder flexion   Shoulder abduction   Shoulder internal rotation   Shoulder external rotation   (Blank rows = not tested)  11/12/24: unable to test due to protocol  SENSATION: WFL  EDEMA: No swelling noted  OBSERVATIONS: Severe fascial restrictions noted along the biceps and deltoid, moderate fascial restrictions along the  scapular region and trapezius.    TODAY'S TREATMENT:                                                                                                                              DATE:   01/08/25 -Manual Therapy: myofascial release and trigger point applied to biceps, deltoid, and scapular region in order to reduce pain and restrictions, as well as improve ROM -A/ROM: seated - 2#, flexion, protraction, abduction, horizontal abduction, er/IR, x15 -Functional Reaching: 2# wrist weight, counter to 1st shelf to 2nd shelf -Weight ball on the wall: 2# wrist weight, up/down, side to side, circles, x20 each  01/01/25 -Manual Therapy: myofascial release and trigger point applied to biceps, deltoid, and scapular region in order to reduce pain and restrictions, as well as improve ROM -A/ROM: seated - flexion, protraction, abduction, horizontal abduction, er/IR, x12 -X to V arms, x12 -Goal Post arms, x12 -Overhead lacing -UBE: level 2, 2' forwards and backwards  12/27/24 -Manual Therapy: myofascial release and trigger point applied to biceps, deltoid, and scapular region in order to reduce pain and restrictions, as well as improve ROM -A/ROM: seated - flexion, protraction, abduction, horizontal abduction, er/IR, x10 -PNF Strengthening: green band, chest pulls, overhead pulls, PNF up, PNF down, er pulls, x12  12/24/24 -Manual Therapy: myofascial release and trigger point applied to biceps, deltoid, and scapular region in order to reduce pain and restrictions, as well as improve ROM -A/ROM: seated - flexion, protraction, abduction, horizontal abduction, er/IR, x10 -Scapular Strengthening: green band, extension, retraction, rows, x15 -Shoulder Strengthening: green band, horizontal abduction, er, IR, x15 -UBE: level 2, 2' forwards and backwards   PATIENT EDUCATION: Education details: PNF Strengthening Person educated: Patient Education method: Explanation, Demonstration, and Handouts Education  comprehension: verbalized understanding, returned demonstration, and needs further education  HOME EXERCISE PROGRAM: Table Slides and Pendulums 12/4: AA/ROM 12/18: A/ROM 1/5: Scapular and Shoulder Strengthening 1/8: PNF Strengthening  GOALS: Goals reviewed with patient? Yes   SHORT TERM GOALS: Target date: 12/12/24  Pt will be provided with and educated on HEP to improve mobility in RUE required for use during ADL completion.   Goal status: IN PROGRESS  2.  Pt will increase RUE P/ROM by 20 degrees to improve ability to use RUE during dressing tasks with minimal compensatory techniques.   Goal status: IN PROGRESS  3.  Pt will increase RUE strength to 3+/5 to improve  ability to reach for items at waist to chest height during bathing and grooming tasks.   Goal status: IN PROGRESS  LONG TERM GOALS: Target date: 01/12/25  Pt will decrease pain in RUE to 3/10 or less to improve ability to sleep for 2+ consecutive hours without waking due to pain.   Goal status: IN PROGRESS  2.  Pt will decrease RUE fascial restrictions to min amounts or less to improve mobility required for functional reaching tasks.   Goal status: IN PROGRESS  3.  Pt will increase RUE A/ROM by 25 degrees to improve ability to use RUE when reaching overhead or behind back during dressing and bathing tasks.   Goal status: IN PROGRESS  4.  Pt will increase RUE strength to 5/5 or greater to improve ability to use RUE when lifting or carrying items during meal preparation/housework/yardwork tasks.   Goal status: IN PROGRESS  5.  Pt will return to highest level of function using RUE as dominant during functional task completion.   Goal status: IN PROGRESS   ASSESSMENT:  CLINICAL IMPRESSION: Pt having no pain at the beginning of the session, however with A/ROM holding a 2# weight, pt started having severe pain and felt like his arm locked up. With further manual therapy pain subsided after ~5 mins. He  struggled the rest of the session with pain and weakness, but was able to tolerate a few more strengthening/stability exercises. OT providing verbal and visual cuing for positioning and technique.   PERFORMANCE DEFICITS: in functional skills including in functional skills including ADLs, IADLs, coordination, tone, ROM, strength, pain, fascial restrictions, muscle spasms, and UE functional use.  PLAN:  OT FREQUENCY: 2x/week  OT DURATION: 8 weeks  PLANNED INTERVENTIONS: 97168 OT Re-evaluation, 97535 self care/ADL training, 02889 therapeutic exercise, 97530 therapeutic activity, 97112 neuromuscular re-education, 97140 manual therapy, 97035 ultrasound, 97010 moist heat, 97032 electrical stimulation (manual), passive range of motion, functional mobility training, energy conservation, coping strategies training, patient/family education, and DME and/or AE instructions  CONSULTED AND AGREED WITH PLAN OF CARE: Patient  PLAN FOR NEXT SESSION: Manual Therapy, P/ROM, Mercer Orts, Wall washes; AA/ROM progressing to A/ROM as able    Valentin Nightingale, OTR/L  [phone] 01/08/2025, 10:13 AM     "

## 2025-01-16 ENCOUNTER — Ambulatory Visit (HOSPITAL_COMMUNITY): Admitting: Occupational Therapy

## 2025-01-18 ENCOUNTER — Encounter (HOSPITAL_COMMUNITY): Payer: Self-pay | Admitting: Occupational Therapy

## 2025-01-18 ENCOUNTER — Ambulatory Visit (HOSPITAL_COMMUNITY): Admitting: Occupational Therapy

## 2025-01-18 DIAGNOSIS — M25611 Stiffness of right shoulder, not elsewhere classified: Secondary | ICD-10-CM

## 2025-01-18 DIAGNOSIS — R29898 Other symptoms and signs involving the musculoskeletal system: Secondary | ICD-10-CM

## 2025-01-18 DIAGNOSIS — M25511 Pain in right shoulder: Secondary | ICD-10-CM

## 2025-01-21 ENCOUNTER — Ambulatory Visit (HOSPITAL_COMMUNITY): Admitting: Occupational Therapy

## 2025-01-24 ENCOUNTER — Ambulatory Visit (HOSPITAL_COMMUNITY): Admitting: Occupational Therapy

## 2025-01-24 ENCOUNTER — Encounter (HOSPITAL_COMMUNITY): Payer: Self-pay | Admitting: Occupational Therapy

## 2025-01-24 DIAGNOSIS — M25611 Stiffness of right shoulder, not elsewhere classified: Secondary | ICD-10-CM

## 2025-01-24 DIAGNOSIS — R29898 Other symptoms and signs involving the musculoskeletal system: Secondary | ICD-10-CM

## 2025-01-24 DIAGNOSIS — M25511 Pain in right shoulder: Secondary | ICD-10-CM

## 2025-01-24 NOTE — Therapy (Signed)
 " OUTPATIENT OCCUPATIONAL THERAPY ORTHO TREATMENT NOTE   Patient Name: Kyle Ellis MRN: [id number] DOB:Sep 20, 1981, 44 y.o., male Today's Date: 01/24/2025   END OF SESSION:  OT End of Session - 01/24/25 1032     Visit Number 14    Number of Visits 25    Date for Recertification  03/15/25   progress note 12/12/24   Authorization Type [employer]    Authorization Time Period 16 visits (11/24-1/19), requesting 12 more visits    Authorization - Visit Number 2    Authorization - Number of Visits 12    OT Start Time 0903    OT Stop Time 0946    OT Time Calculation (min) 43 min    Activity Tolerance Patient tolerated treatment well    Behavior During Therapy [facility] for tasks assessed/performed          Past Medical History:  Diagnosis Date   Abscess of right olecranon bursa 05/19/2024   History of kidney stones    Past Surgical History:  Procedure Laterality Date   CYSTOSCOPY WITH RETROGRADE PYELOGRAM, URETEROSCOPY AND STENT PLACEMENT Left 07/11/2020   Procedure: CYSTOSCOPY WITH RETROGRADE PYELOGRAM, URETEROSCOPY AND STENT PLACEMENT;  Surgeon: Alvaro Hummer, MD;  Location: [facility];  Service: Urology;  Laterality: Left;  1 HR   ELBOW SURGERY Right    HERNIA REPAIR     20 years ago   HOLMIUM LASER APPLICATION Left 07/11/2020   Procedure: HOLMIUM LASER APPLICATION;  Surgeon: Alvaro Hummer, MD;  Location: [facility];  Service: Urology;  Laterality: Left;   INCISION AND DRAINAGE, BELOW ELBOW, BURSA Right 05/19/2024   Procedure: INCISION AND DRAINAGE, ELBOW, BURSA;  Surgeon: Josefina Chew, MD;  Location: [facility];  Service: Orthopedics;  Laterality: Right;   OLECRANON BURSECTOMY Right 05/19/2024   Procedure: JENELLE SPAIN;  Surgeon: Josefina Chew, MD;  Location: [facility];  Service: Orthopedics;  Laterality: Right;   piece of spine removed      2019 - done at surgical center in [place] by dr elsner    Patient Active Problem List   Diagnosis Date Noted   Abscess of right olecranon bursa  05/19/2024    PCP: [facility] REFERRING PROVIDER: Cristy Bonner DASEN, MD  ONSET DATE: 09/26/24  REFERRING DIAG: S01.109 (ICD-10-CM) - Other specified postprocedural states   THERAPY DIAG:  Acute pain of right shoulder  Shoulder stiffness, right  Other symptoms and signs involving the musculoskeletal system  Rationale for Evaluation and Treatment: Rehabilitation  SUBJECTIVE:   SUBJECTIVE STATEMENT: S: It's doing ok  PERTINENT HISTORY: Pt s/p R massive RCR, Biceps tendon transfer, DCE, SA on 09/26/24  PRECAUTIONS: Shoulder  WEIGHT BEARING RESTRICTIONS: Yes >2lbs  PAIN:  Are you having pain? Yes: NPRS scale: 1/10 Pain location: anterior shoulder girdle/biceps Pain description: Sore Aggravating factors: movement Relieving factors: rest   FALLS: Has patient fallen in last 6 months? No  PLOF: Independent  PATIENT GOALS: To use his arm normally  NEXT MD VISIT: 12/25/24  OBJECTIVE:   HAND DOMINANCE: Right  ADLs: Overall ADLs: Pt reports being unable to reach his arm forward and put his key in the ignition, Pt unable reach up limiting dressing and bathing. Pt unable to lift and carry any pots and pans, groceries, or yard tools due to pain and protocol   FUNCTIONAL OUTCOME MEASURES: Quick Dash: 47.73 01/18/25: 45.45  UPPER EXTREMITY ROM:       Assessed in supine, er/IR adducted  Passive ROM Right eval Right 01/18/25  Shoulder  flexion 132 153  Shoulder abduction 121 162  Shoulder internal rotation 90 90  Shoulder external rotation 10 62  (Blank rows = not tested)    Assessed in seated, er/IR adducted  Active ROM Right eval Right 01/18/25  Shoulder flexion  142  Shoulder abduction  148  Shoulder internal rotation  90  Shoulder external rotation  45  (Blank rows = not tested)  UPPER EXTREMITY MMT:     Assessed in seated, er/IR adducted  MMT Right eval Right 01/18/25  Shoulder flexion  3/5  Shoulder abduction  3+/5  Shoulder internal  rotation  4/5  Shoulder external rotation  3/5  (Blank rows = not tested)  11/12/24: unable to test due to protocol  SENSATION: WFL  EDEMA: No swelling noted  OBSERVATIONS: Severe fascial restrictions noted along the biceps and deltoid, moderate fascial restrictions along the scapular region and trapezius.    TODAY'S TREATMENT:                                                                                                                              DATE:   01/24/25 -Manual Therapy: myofascial release and trigger point applied to biceps, deltoid, and scapular region in order to reduce pain and restrictions, as well as improve ROM -A/ROM: seated - flexion, protraction, abduction, horizontal abduction, er/IR, x15 -X to V arms, x15 -Goal Post arms, x15 -ABC's on the wall - green weighted ball -Body Blade: flexion - vertical and horizontal, abduction - vertical and horizontal, x30 each -Arms on fire Ball on the wall - green weighted ball - flexion then abduction, x10 each move -UBE: level 4, 2.5' forwards and backwards  01/18/25 -Manual Therapy: myofascial release and trigger point applied to biceps, deltoid, and scapular region in order to reduce pain and restrictions, as well as improve ROM -P/ROM: supine - flexion, abduction, er/IR, x6 -A/ROM: seated - flexion, protraction, abduction, horizontal abduction, er/IR, x15 -X to V arms, x15 -Goal Post arms, x15 -Wall Climbs: flexion, x10 -Reassessment  01/08/25 -Manual Therapy: myofascial release and trigger point applied to biceps, deltoid, and scapular region in order to reduce pain and restrictions, as well as improve ROM -A/ROM: seated - 2#, flexion, protraction, abduction, horizontal abduction, er/IR, x15 -Functional Reaching: 2# wrist weight, counter to 1st shelf to 2nd shelf -Weight ball on the wall: 2# wrist weight, up/down, side to side, circles, x20 each   PATIENT EDUCATION: Education details: Continue HEP Person  educated: Patient Education method: Explanation, Demonstration, and Handouts Education comprehension: verbalized understanding, returned demonstration, and needs further education  HOME EXERCISE PROGRAM: Table Slides and Pendulums 12/4: AA/ROM 12/18: A/ROM 1/5: Scapular and Shoulder Strengthening 1/8: PNF Strengthening  GOALS: Goals reviewed with patient? Yes   SHORT TERM GOALS: Target date: 12/12/24  Pt will be provided with and educated on HEP to improve mobility in RUE required for use during ADL completion.   Goal status: IN PROGRESS  2.  Pt will increase  RUE P/ROM by 20 degrees to improve ability to use RUE during dressing tasks with minimal compensatory techniques.   Goal status: IN PROGRESS  3.  Pt will increase RUE strength to 3+/5 to improve ability to reach for items at waist to chest height during bathing and grooming tasks.   Goal status: IN PROGRESS  LONG TERM GOALS: Target date: 01/12/25  Pt will decrease pain in RUE to 3/10 or less to improve ability to sleep for 2+ consecutive hours without waking due to pain.   Goal status: IN PROGRESS  2.  Pt will decrease RUE fascial restrictions to min amounts or less to improve mobility required for functional reaching tasks.   Goal status: IN PROGRESS  3.  Pt will increase RUE A/ROM by 25 degrees to improve ability to use RUE when reaching overhead or behind back during dressing and bathing tasks.   Goal status: IN PROGRESS  4.  Pt will increase RUE strength to 5/5 or greater to improve ability to use RUE when lifting or carrying items during meal preparation/housework/yardwork tasks.   Goal status: IN PROGRESS  5.  Pt will return to highest level of function using RUE as dominant during functional task completion.   Goal status: IN PROGRESS   ASSESSMENT:  CLINICAL IMPRESSION: Pt had reduced pain this session, as well as improved ROM. He continues to have moderate fascial restrictions addressed with manual  therapy. This session pt demonstrating improved endurance and tolerance to all exercises. He continues to have weakness, however this appears to be improving functionally. OT providing verbal and tactile cuing for positioning and technique throughout session.   PERFORMANCE DEFICITS: in functional skills including in functional skills including ADLs, IADLs, coordination, tone, ROM, strength, pain, fascial restrictions, muscle spasms, and UE functional use.  PLAN:  OT FREQUENCY: 2x/week  OT DURATION: 8 weeks  PLANNED INTERVENTIONS: 97168 OT Re-evaluation, 97535 self care/ADL training, 02889 therapeutic exercise, 97530 therapeutic activity, 97112 neuromuscular re-education, 97140 manual therapy, 97035 ultrasound, 97010 moist heat, 97032 electrical stimulation (manual), passive range of motion, functional mobility training, energy conservation, coping strategies training, patient/family education, and DME and/or AE instructions  CONSULTED AND AGREED WITH PLAN OF CARE: Patient  PLAN FOR NEXT SESSION: Manual Therapy, P/ROM, Mercer Orts, Wall washes; AA/ROM progressing to A/ROM as able    Valentin Nightingale, OTR/L  [phone] 01/24/2025, 10:33 AM   Managed Medicaid Authorization Request Treatment Start Date: 02-05-2025  Visit Dx Codes: M25.511, M25.611, R29.898  Functional Tool Score: quickdash: 45.45  For all possible CPT codes, reference the Planned Interventions line above.     Check all conditions that are expected to impact treatment: {Conditions expected to impact treatment:None of these apply   If treatment provided at initial evaluation, no treatment charged due to lack of authorization.     "

## 2025-01-28 ENCOUNTER — Ambulatory Visit (HOSPITAL_COMMUNITY): Admitting: Occupational Therapy

## 2025-01-31 ENCOUNTER — Ambulatory Visit (HOSPITAL_COMMUNITY): Admitting: Occupational Therapy

## 2025-02-04 ENCOUNTER — Ambulatory Visit (HOSPITAL_COMMUNITY): Admitting: Occupational Therapy

## 2025-02-07 ENCOUNTER — Ambulatory Visit (HOSPITAL_COMMUNITY): Admitting: Occupational Therapy
